# Patient Record
Sex: Male | Born: 1935
Health system: Southern US, Community
[De-identification: ages and names within clinical notes are randomized; demographics above are authoritative.]

## PROBLEM LIST (undated history)

## (undated) DIAGNOSIS — I10 Essential (primary) hypertension: Secondary | ICD-10-CM

## (undated) HISTORY — PX: TONSILLECTOMY: SUR1361

## (undated) HISTORY — PX: APPENDECTOMY: SHX54

## (undated) HISTORY — PX: HEMORROIDECTOMY: SUR656

---

## 2010-08-23 DIAGNOSIS — E119 Type 2 diabetes mellitus without complications: Secondary | ICD-10-CM | POA: Insufficient documentation

## 2011-10-23 DIAGNOSIS — I493 Ventricular premature depolarization: Secondary | ICD-10-CM | POA: Insufficient documentation

## 2013-05-03 DIAGNOSIS — E781 Pure hyperglyceridemia: Secondary | ICD-10-CM | POA: Insufficient documentation

## 2014-07-16 DIAGNOSIS — H25812 Combined forms of age-related cataract, left eye: Secondary | ICD-10-CM | POA: Insufficient documentation

## 2015-01-12 DIAGNOSIS — E785 Hyperlipidemia, unspecified: Secondary | ICD-10-CM | POA: Diagnosis not present

## 2015-01-12 DIAGNOSIS — N183 Chronic kidney disease, stage 3 (moderate): Secondary | ICD-10-CM | POA: Diagnosis not present

## 2015-01-12 DIAGNOSIS — I10 Essential (primary) hypertension: Secondary | ICD-10-CM | POA: Diagnosis not present

## 2015-01-12 DIAGNOSIS — I129 Hypertensive chronic kidney disease with stage 1 through stage 4 chronic kidney disease, or unspecified chronic kidney disease: Secondary | ICD-10-CM | POA: Diagnosis not present

## 2015-01-12 DIAGNOSIS — E1122 Type 2 diabetes mellitus with diabetic chronic kidney disease: Secondary | ICD-10-CM | POA: Diagnosis not present

## 2015-01-12 DIAGNOSIS — E1159 Type 2 diabetes mellitus with other circulatory complications: Secondary | ICD-10-CM | POA: Diagnosis not present

## 2015-02-16 DIAGNOSIS — H02834 Dermatochalasis of left upper eyelid: Secondary | ICD-10-CM | POA: Diagnosis not present

## 2015-02-16 DIAGNOSIS — H40003 Preglaucoma, unspecified, bilateral: Secondary | ICD-10-CM | POA: Diagnosis not present

## 2015-02-16 DIAGNOSIS — H01004 Unspecified blepharitis left upper eyelid: Secondary | ICD-10-CM | POA: Diagnosis not present

## 2015-02-16 DIAGNOSIS — Z961 Presence of intraocular lens: Secondary | ICD-10-CM | POA: Diagnosis not present

## 2015-02-16 DIAGNOSIS — H26493 Other secondary cataract, bilateral: Secondary | ICD-10-CM | POA: Diagnosis not present

## 2015-02-16 DIAGNOSIS — H01001 Unspecified blepharitis right upper eyelid: Secondary | ICD-10-CM | POA: Diagnosis not present

## 2015-02-16 DIAGNOSIS — H527 Unspecified disorder of refraction: Secondary | ICD-10-CM | POA: Diagnosis not present

## 2015-02-16 DIAGNOSIS — H02831 Dermatochalasis of right upper eyelid: Secondary | ICD-10-CM | POA: Diagnosis not present

## 2015-02-16 DIAGNOSIS — H353131 Nonexudative age-related macular degeneration, bilateral, early dry stage: Secondary | ICD-10-CM | POA: Diagnosis not present

## 2015-09-01 DIAGNOSIS — E785 Hyperlipidemia, unspecified: Secondary | ICD-10-CM | POA: Diagnosis not present

## 2015-09-01 DIAGNOSIS — I129 Hypertensive chronic kidney disease with stage 1 through stage 4 chronic kidney disease, or unspecified chronic kidney disease: Secondary | ICD-10-CM | POA: Diagnosis not present

## 2015-09-01 DIAGNOSIS — E1159 Type 2 diabetes mellitus with other circulatory complications: Secondary | ICD-10-CM | POA: Diagnosis not present

## 2015-09-01 DIAGNOSIS — N183 Chronic kidney disease, stage 3 (moderate): Secondary | ICD-10-CM | POA: Diagnosis not present

## 2015-09-01 DIAGNOSIS — E1122 Type 2 diabetes mellitus with diabetic chronic kidney disease: Secondary | ICD-10-CM | POA: Diagnosis not present

## 2015-09-14 DIAGNOSIS — N183 Chronic kidney disease, stage 3 (moderate): Secondary | ICD-10-CM | POA: Diagnosis not present

## 2015-09-14 DIAGNOSIS — I129 Hypertensive chronic kidney disease with stage 1 through stage 4 chronic kidney disease, or unspecified chronic kidney disease: Secondary | ICD-10-CM | POA: Diagnosis not present

## 2015-09-14 DIAGNOSIS — E785 Hyperlipidemia, unspecified: Secondary | ICD-10-CM | POA: Diagnosis not present

## 2015-09-14 DIAGNOSIS — E1159 Type 2 diabetes mellitus with other circulatory complications: Secondary | ICD-10-CM | POA: Diagnosis not present

## 2015-09-14 DIAGNOSIS — E1122 Type 2 diabetes mellitus with diabetic chronic kidney disease: Secondary | ICD-10-CM | POA: Diagnosis not present

## 2015-12-01 DIAGNOSIS — E785 Hyperlipidemia, unspecified: Secondary | ICD-10-CM | POA: Diagnosis not present

## 2015-12-06 DIAGNOSIS — E785 Hyperlipidemia, unspecified: Secondary | ICD-10-CM | POA: Diagnosis not present

## 2015-12-06 DIAGNOSIS — E1169 Type 2 diabetes mellitus with other specified complication: Secondary | ICD-10-CM | POA: Diagnosis not present

## 2015-12-06 DIAGNOSIS — E1159 Type 2 diabetes mellitus with other circulatory complications: Secondary | ICD-10-CM | POA: Diagnosis not present

## 2015-12-06 DIAGNOSIS — I129 Hypertensive chronic kidney disease with stage 1 through stage 4 chronic kidney disease, or unspecified chronic kidney disease: Secondary | ICD-10-CM | POA: Diagnosis not present

## 2015-12-06 DIAGNOSIS — E1122 Type 2 diabetes mellitus with diabetic chronic kidney disease: Secondary | ICD-10-CM | POA: Diagnosis not present

## 2015-12-06 DIAGNOSIS — N183 Chronic kidney disease, stage 3 unspecified: Secondary | ICD-10-CM | POA: Insufficient documentation

## 2015-12-07 DIAGNOSIS — E1169 Type 2 diabetes mellitus with other specified complication: Secondary | ICD-10-CM | POA: Insufficient documentation

## 2016-08-02 DIAGNOSIS — R5383 Other fatigue: Secondary | ICD-10-CM | POA: Diagnosis not present

## 2016-08-02 DIAGNOSIS — N183 Chronic kidney disease, stage 3 (moderate): Secondary | ICD-10-CM | POA: Diagnosis not present

## 2016-08-02 DIAGNOSIS — E1122 Type 2 diabetes mellitus with diabetic chronic kidney disease: Secondary | ICD-10-CM | POA: Diagnosis not present

## 2016-08-02 DIAGNOSIS — Z79899 Other long term (current) drug therapy: Secondary | ICD-10-CM | POA: Diagnosis not present

## 2016-08-02 DIAGNOSIS — R3915 Urgency of urination: Secondary | ICD-10-CM | POA: Insufficient documentation

## 2016-08-02 DIAGNOSIS — Z125 Encounter for screening for malignant neoplasm of prostate: Secondary | ICD-10-CM | POA: Diagnosis not present

## 2016-08-02 DIAGNOSIS — E1169 Type 2 diabetes mellitus with other specified complication: Secondary | ICD-10-CM | POA: Diagnosis not present

## 2016-08-02 DIAGNOSIS — E1159 Type 2 diabetes mellitus with other circulatory complications: Secondary | ICD-10-CM | POA: Diagnosis not present

## 2016-08-31 DIAGNOSIS — H47393 Other disorders of optic disc, bilateral: Secondary | ICD-10-CM | POA: Diagnosis not present

## 2016-08-31 DIAGNOSIS — H40003 Preglaucoma, unspecified, bilateral: Secondary | ICD-10-CM | POA: Diagnosis not present

## 2016-08-31 DIAGNOSIS — H52203 Unspecified astigmatism, bilateral: Secondary | ICD-10-CM | POA: Diagnosis not present

## 2016-08-31 DIAGNOSIS — E119 Type 2 diabetes mellitus without complications: Secondary | ICD-10-CM | POA: Diagnosis not present

## 2016-09-04 DIAGNOSIS — E1159 Type 2 diabetes mellitus with other circulatory complications: Secondary | ICD-10-CM | POA: Diagnosis not present

## 2016-09-04 DIAGNOSIS — M1711 Unilateral primary osteoarthritis, right knee: Secondary | ICD-10-CM | POA: Diagnosis not present

## 2016-09-04 DIAGNOSIS — I10 Essential (primary) hypertension: Secondary | ICD-10-CM | POA: Diagnosis not present

## 2016-09-14 DIAGNOSIS — H47393 Other disorders of optic disc, bilateral: Secondary | ICD-10-CM | POA: Diagnosis not present

## 2016-09-14 DIAGNOSIS — H40003 Preglaucoma, unspecified, bilateral: Secondary | ICD-10-CM | POA: Diagnosis not present

## 2017-02-05 DIAGNOSIS — E1159 Type 2 diabetes mellitus with other circulatory complications: Secondary | ICD-10-CM | POA: Diagnosis not present

## 2017-02-05 DIAGNOSIS — E1169 Type 2 diabetes mellitus with other specified complication: Secondary | ICD-10-CM | POA: Diagnosis not present

## 2017-02-05 DIAGNOSIS — M545 Low back pain: Secondary | ICD-10-CM | POA: Diagnosis not present

## 2017-02-05 DIAGNOSIS — M5441 Lumbago with sciatica, right side: Secondary | ICD-10-CM | POA: Diagnosis not present

## 2017-02-05 DIAGNOSIS — R7989 Other specified abnormal findings of blood chemistry: Secondary | ICD-10-CM | POA: Diagnosis not present

## 2017-02-05 DIAGNOSIS — E785 Hyperlipidemia, unspecified: Secondary | ICD-10-CM | POA: Diagnosis not present

## 2017-02-05 DIAGNOSIS — E1122 Type 2 diabetes mellitus with diabetic chronic kidney disease: Secondary | ICD-10-CM | POA: Diagnosis not present

## 2017-02-05 DIAGNOSIS — I129 Hypertensive chronic kidney disease with stage 1 through stage 4 chronic kidney disease, or unspecified chronic kidney disease: Secondary | ICD-10-CM | POA: Diagnosis not present

## 2017-02-05 DIAGNOSIS — N183 Chronic kidney disease, stage 3 (moderate): Secondary | ICD-10-CM | POA: Diagnosis not present

## 2017-02-09 DIAGNOSIS — E538 Deficiency of other specified B group vitamins: Secondary | ICD-10-CM | POA: Insufficient documentation

## 2017-09-26 DIAGNOSIS — R152 Fecal urgency: Secondary | ICD-10-CM | POA: Diagnosis not present

## 2017-09-26 DIAGNOSIS — R159 Full incontinence of feces: Secondary | ICD-10-CM | POA: Diagnosis not present

## 2017-09-26 DIAGNOSIS — N3941 Urge incontinence: Secondary | ICD-10-CM | POA: Diagnosis not present

## 2017-09-26 DIAGNOSIS — I129 Hypertensive chronic kidney disease with stage 1 through stage 4 chronic kidney disease, or unspecified chronic kidney disease: Secondary | ICD-10-CM | POA: Diagnosis not present

## 2017-09-26 DIAGNOSIS — Z0000 Encounter for general adult medical examination without abnormal findings: Secondary | ICD-10-CM | POA: Diagnosis not present

## 2017-09-26 DIAGNOSIS — E1122 Type 2 diabetes mellitus with diabetic chronic kidney disease: Secondary | ICD-10-CM | POA: Diagnosis not present

## 2017-09-26 DIAGNOSIS — Z125 Encounter for screening for malignant neoplasm of prostate: Secondary | ICD-10-CM | POA: Diagnosis not present

## 2017-09-26 DIAGNOSIS — E538 Deficiency of other specified B group vitamins: Secondary | ICD-10-CM | POA: Diagnosis not present

## 2017-09-26 DIAGNOSIS — N183 Chronic kidney disease, stage 3 (moderate): Secondary | ICD-10-CM | POA: Diagnosis not present

## 2017-09-26 DIAGNOSIS — R3915 Urgency of urination: Secondary | ICD-10-CM | POA: Diagnosis not present

## 2017-09-26 DIAGNOSIS — E1159 Type 2 diabetes mellitus with other circulatory complications: Secondary | ICD-10-CM | POA: Diagnosis not present

## 2017-11-14 DIAGNOSIS — E1122 Type 2 diabetes mellitus with diabetic chronic kidney disease: Secondary | ICD-10-CM | POA: Diagnosis not present

## 2017-11-14 DIAGNOSIS — E1159 Type 2 diabetes mellitus with other circulatory complications: Secondary | ICD-10-CM | POA: Diagnosis not present

## 2017-11-14 DIAGNOSIS — N183 Chronic kidney disease, stage 3 (moderate): Secondary | ICD-10-CM | POA: Diagnosis not present

## 2017-11-14 DIAGNOSIS — I152 Hypertension secondary to endocrine disorders: Secondary | ICD-10-CM | POA: Diagnosis not present

## 2017-11-14 DIAGNOSIS — R413 Other amnesia: Secondary | ICD-10-CM | POA: Diagnosis not present

## 2017-12-14 DIAGNOSIS — R609 Edema, unspecified: Secondary | ICD-10-CM | POA: Diagnosis not present

## 2017-12-14 DIAGNOSIS — E1122 Type 2 diabetes mellitus with diabetic chronic kidney disease: Secondary | ICD-10-CM | POA: Diagnosis not present

## 2017-12-14 DIAGNOSIS — E538 Deficiency of other specified B group vitamins: Secondary | ICD-10-CM | POA: Diagnosis not present

## 2017-12-14 DIAGNOSIS — E1159 Type 2 diabetes mellitus with other circulatory complications: Secondary | ICD-10-CM | POA: Diagnosis not present

## 2017-12-14 DIAGNOSIS — I152 Hypertension secondary to endocrine disorders: Secondary | ICD-10-CM | POA: Diagnosis not present

## 2017-12-14 DIAGNOSIS — N183 Chronic kidney disease, stage 3 (moderate): Secondary | ICD-10-CM | POA: Diagnosis not present

## 2018-03-08 DIAGNOSIS — E1122 Type 2 diabetes mellitus with diabetic chronic kidney disease: Secondary | ICD-10-CM | POA: Diagnosis not present

## 2018-03-08 DIAGNOSIS — E1159 Type 2 diabetes mellitus with other circulatory complications: Secondary | ICD-10-CM | POA: Diagnosis not present

## 2018-03-08 DIAGNOSIS — E538 Deficiency of other specified B group vitamins: Secondary | ICD-10-CM | POA: Diagnosis not present

## 2018-03-08 DIAGNOSIS — N183 Chronic kidney disease, stage 3 (moderate): Secondary | ICD-10-CM | POA: Diagnosis not present

## 2018-03-08 DIAGNOSIS — R609 Edema, unspecified: Secondary | ICD-10-CM | POA: Diagnosis not present

## 2018-03-08 DIAGNOSIS — I129 Hypertensive chronic kidney disease with stage 1 through stage 4 chronic kidney disease, or unspecified chronic kidney disease: Secondary | ICD-10-CM | POA: Diagnosis not present

## 2018-06-15 ENCOUNTER — Emergency Department (HOSPITAL_BASED_OUTPATIENT_CLINIC_OR_DEPARTMENT_OTHER)
Admission: EM | Admit: 2018-06-15 | Discharge: 2018-06-15 | Disposition: A | Discharge status: Home / Self Care | Payer: PPO | Attending: Emergency Medicine | Admitting: Emergency Medicine

## 2018-06-15 ENCOUNTER — Other Ambulatory Visit: Payer: Self-pay

## 2018-06-15 ENCOUNTER — Encounter (HOSPITAL_BASED_OUTPATIENT_CLINIC_OR_DEPARTMENT_OTHER): Payer: Self-pay | Admitting: Emergency Medicine

## 2018-06-15 ENCOUNTER — Emergency Department (HOSPITAL_BASED_OUTPATIENT_CLINIC_OR_DEPARTMENT_OTHER): Payer: PPO

## 2018-06-15 DIAGNOSIS — H1131 Conjunctival hemorrhage, right eye: Secondary | ICD-10-CM

## 2018-06-15 DIAGNOSIS — E1159 Type 2 diabetes mellitus with other circulatory complications: Secondary | ICD-10-CM | POA: Diagnosis not present

## 2018-06-15 DIAGNOSIS — S0990XA Unspecified injury of head, initial encounter: Secondary | ICD-10-CM | POA: Diagnosis present

## 2018-06-15 DIAGNOSIS — S0511XA Contusion of eyeball and orbital tissues, right eye, initial encounter: Secondary | ICD-10-CM | POA: Diagnosis not present

## 2018-06-15 DIAGNOSIS — G319 Degenerative disease of nervous system, unspecified: Secondary | ICD-10-CM | POA: Diagnosis not present

## 2018-06-15 DIAGNOSIS — S0240CA Maxillary fracture, right side, initial encounter for closed fracture: Secondary | ICD-10-CM | POA: Diagnosis not present

## 2018-06-15 DIAGNOSIS — S02401A Maxillary fracture, unspecified, initial encounter for closed fracture: Secondary | ICD-10-CM

## 2018-06-15 DIAGNOSIS — H05231 Hemorrhage of right orbit: Secondary | ICD-10-CM | POA: Diagnosis not present

## 2018-06-15 DIAGNOSIS — Y92480 Sidewalk as the place of occurrence of the external cause: Secondary | ICD-10-CM | POA: Insufficient documentation

## 2018-06-15 DIAGNOSIS — H5789 Other specified disorders of eye and adnexa: Secondary | ICD-10-CM | POA: Diagnosis not present

## 2018-06-15 DIAGNOSIS — S0083XA Contusion of other part of head, initial encounter: Secondary | ICD-10-CM | POA: Diagnosis not present

## 2018-06-15 DIAGNOSIS — Y999 Unspecified external cause status: Secondary | ICD-10-CM | POA: Insufficient documentation

## 2018-06-15 DIAGNOSIS — W010XXA Fall on same level from slipping, tripping and stumbling without subsequent striking against object, initial encounter: Secondary | ICD-10-CM | POA: Insufficient documentation

## 2018-06-15 DIAGNOSIS — J342 Deviated nasal septum: Secondary | ICD-10-CM | POA: Diagnosis not present

## 2018-06-15 DIAGNOSIS — I10 Essential (primary) hypertension: Secondary | ICD-10-CM

## 2018-06-15 DIAGNOSIS — Y9301 Activity, walking, marching and hiking: Secondary | ICD-10-CM | POA: Diagnosis not present

## 2018-06-15 HISTORY — DX: Essential (primary) hypertension: I10

## 2018-06-15 NOTE — ED Provider Notes (Signed)
[place] EMERGENCY DEPARTMENT Provider Note   CSN: [id number] Arrival date & time: 06/15/18  0944    History   Chief Complaint Chief Complaint  Patient presents with  . Fall    HPI David Nichols is a 85 y.o. male.     HPI Patient presents to the emergency room for evaluation of an eye injury.  Patient was walking yesterday when he tripped and fell on the sidewalk injuring his face.  Patient did not lose consciousness.  He is not having a headache but he has developed significant bruising around his eye.  Patient also noticed that his eye was red this morning.  He does not feel that his vision is affected other than having some obstruction of his vision from the swollen eyelid.  Patient denies any neck pain or any other injuries.  He does take an aspirin but denies any other anticoagulants. Past Medical History:  Diagnosis Date  . Hypertension     There are no active problems to display for this patient.   Past Surgical History:  Procedure Laterality Date  . APPENDECTOMY    . HEMORROIDECTOMY    . TONSILLECTOMY          Home Medications    Prior to Admission medications   Not on File    Family History No family history on file.  Social History Social History   Tobacco Use  . Smoking status: Never Smoker  . Smokeless tobacco: Never Used  Substance Use Topics  . Alcohol use: Not Currently  . Drug use: Never     Allergies   Patient has no known allergies.   Review of Systems Review of Systems  All other systems reviewed and are negative.    Physical Exam Updated Vital Signs BP (!) 191/83 (BP Location: Right Arm)   Pulse 64   Temp 98.4 F (36.9 C) (Oral)   Resp 18   Ht 1.727 m (5\' 8" )   Wt 88.9 kg   SpO2 93%   BMI 29.80 kg/m   Physical Exam Vitals signs and nursing note reviewed.  Constitutional:      General: He is not in acute distress.    Appearance: He is well-developed.  HENT:     Head: Normocephalic. Contusion  present.     Right Ear: External ear normal.     Left Ear: External ear normal.  Eyes:     General: No scleral icterus.       Right eye: No discharge.        Left eye: No discharge.     Conjunctiva/sclera:     Right eye: Hemorrhage present.     Comments: Ecchymoses surrounding the eye, ecchymoses and edema of the upper and lower eyelids, large subconjunctival hemorrhage, pupil is round and reactive, periorbital tenderness  Neck:     Musculoskeletal: Neck supple.     Trachea: No tracheal deviation.  Cardiovascular:     Rate and Rhythm: Normal rate.  Pulmonary:     Effort: Pulmonary effort is normal. No respiratory distress.     Breath sounds: No stridor.  Abdominal:     General: There is no distension.  Musculoskeletal:        General: No swelling or deformity.  Skin:    General: Skin is warm and dry.     Findings: No rash.  Neurological:     Mental Status: He is alert.     Cranial Nerves: Cranial nerve deficit: no gross deficits.  ED Treatments / Results  Labs (all labs ordered are listed, but only abnormal results are displayed) Labs Reviewed - No data to display  EKG None  Radiology Ct Orbits Wo Contrast  Result Date: 06/15/2018 CLINICAL DATA:  Pain following fall EXAM: CT ORBITS WITHOUT CONTRAST TECHNIQUE: Multidetector CT images were obtained using the standard protocol without intravenous contrast. COMPARISON:  None. FINDINGS: Orbits: There is preseptal soft tissue edema on the right. No intraorbital lesion evident. Orbits appear symmetric bilaterally. Globes bilaterally appear intact. There is a nondisplaced fracture along the anterior right maxillary wall medially. There is no orbital wall fracture. No blastic or lytic bone lesions. Visualized sinuses: There is a small amount of fluid in the inferior right maxillary antrum which may be secondary to trauma. There is mucosal thickening in the right frontal sinus. Paranasal sinuses which are visualized elsewhere  appear unremarkable. Ostiomeatal unit complexes are patent bilaterally. There is mild rightward deviation of the nasal septum. There is no nares obstruction. Soft tissues: There is soft tissue swelling over the lateral upper right face and preseptal orbital region. There is also soft tissue swelling with developing hematoma over the lateral right frontal sinus region. No fracture evident in this area. No soft tissue abscess. Limited intracranial: There is moderate diffuse atrophy. Visualized intracranial regions show no focal infarct. No mass or hemorrhage. No midline shift. IMPRESSION: 1. No intraorbital fracture or dislocation. No intraorbital lesion. There is preseptal soft tissue edema over the right orbit. 2. Nondisplaced fracture of the medial anterior right maxillary sinus wall. No other fracture evident. Small amount of fluid noted in inferior right maxillary antrum. 3. There is soft tissue swelling over the upper face on the right. There is also developing hematoma over the right lateral frontal sinus. No underlying fracture. 4. Ostiomeatal unit complexes are patent bilaterally. There is rightward deviation of the nasal septum. 5.  Moderate diffuse atrophy intracranially. Electronically Signed   By: Lowella Grip III M.D.   On: 06/15/2018 11:02    Procedures Procedures (including critical care time)  Medications Ordered in ED Medications - No data to display   Initial Impression / Assessment and Plan / ED Course  I have reviewed the triage vital signs and the nursing notes.  Pertinent labs & imaging results that were available during my care of the patient were reviewed by me and considered in my medical decision making (see chart for details).   Patient presented with a facial injury after a fall yesterday.  On exam patient was noted to have significant facial bruising and a subconjunctival hemorrhage.  He did not have any evidence of entrapment.  He is not having any headaches or neck  pain.  No signs to suggest serious head injury.  CT scan does demonstrate a sinus fracture but no evidence of orbital blowout fracture.  Patient is having normal vision I do not see any other evidence of ocular injury other than subconjunctival hemorrhage.  Recommend follow-up with a primary care doctor regarding his hypertension.  Recommend outpatient follow-up with an ophthalmologist.  Ice and over-the-counter medications as needed  Final Clinical Impressions(s) / ED Diagnoses   Final diagnoses:  Contusion of face, initial encounter  Subconjunctival hemorrhage of right eye  Hypertension, unspecified type  Closed fracture of maxillary sinus, initial encounter [facility])    ED Discharge Orders    None       Dorie Rank, MD 06/15/18 1126

## 2018-06-15 NOTE — Discharge Instructions (Signed)
Apply ice to help with the swelling and bruising.  Take over-the-counter medications as needed for pain.  Follow-up with an eye doctor next week to be rechecked  The fracture noted on the CT scan should heal on its own in time.  Return to the ED for trouble with headache confusion, vomiting

## 2018-06-15 NOTE — ED Triage Notes (Signed)
Pt fell yesterday, face first on the sidewalk. Denies LOC. Has bruising around R eye and bleeding in the conjunctiva noted. No other reported injuries.

## 2018-06-15 NOTE — ED Notes (Signed)
Pt verbalized understanding of dc instructions.

## 2018-06-15 NOTE — ED Notes (Signed)
Pt fell yesterday, striking right side of forehead on pavement.  Denies LOC, significant bruising/swelling surrounding right eye/orbit.  Sclera/globe of eye reddened.  Pt denies vision changes, utilizes reading glasses only, no prescription lenses.  Denies dizziness, ambulates with steady gait.

## 2018-06-17 DIAGNOSIS — Z79899 Other long term (current) drug therapy: Secondary | ICD-10-CM | POA: Diagnosis not present

## 2018-06-17 DIAGNOSIS — H1131 Conjunctival hemorrhage, right eye: Secondary | ICD-10-CM | POA: Diagnosis not present

## 2018-06-17 DIAGNOSIS — I10 Essential (primary) hypertension: Secondary | ICD-10-CM | POA: Diagnosis not present

## 2018-06-17 DIAGNOSIS — W19XXXD Unspecified fall, subsequent encounter: Secondary | ICD-10-CM | POA: Diagnosis not present

## 2018-06-17 DIAGNOSIS — S0083XD Contusion of other part of head, subsequent encounter: Secondary | ICD-10-CM | POA: Diagnosis not present

## 2018-06-17 DIAGNOSIS — S0240CD Maxillary fracture, right side, subsequent encounter for fracture with routine healing: Secondary | ICD-10-CM | POA: Diagnosis not present

## 2018-06-19 ENCOUNTER — Other Ambulatory Visit: Payer: Self-pay | Admitting: *Deleted

## 2018-06-19 NOTE — Patient Outreach (Signed)
[place] [facility]) Care Management  06/19/2018  Akif Weldy Sr. 03-01-1932 [id number]   Subjective: Telephone call to patient's home number, no answer, left HIPAA compliant voicemail message, and requested call back.    Objective: Per KPN (Knowledge Performance Now, point of care tool) and chart review, patient has had no recent hospitalizations.  Patient had ED visit on 06/15/2018 for status post fall, Contusion of face, Subconjunctival hemorrhage of right eye, and Closed fracture of maxillary sinus. Patient also has a history of hypertension.      Assessment: Received [employer] Nurse Call Line follow up referral on 06/17/2018.   Referral reason: Patient fell on 06/14/2018, has a black eye, blood pressure 185/87, base line pressure 170/ 75, and nurse call line advised patient to go to ED.   Screening  follow up pending patient contact.     Plan: RNCM will call patient for 2nd telephone outreach attempt within 4 business days, [facility] EMMI follow up, and proceed with case closure, within 10 business days if no return call.     Thatcher Doberstein H. Annia Friendly, BSN, [place] Management [facility] Telephonic CM Phone: [phone] Fax: [phone]

## 2018-06-20 ENCOUNTER — Other Ambulatory Visit: Payer: Self-pay | Admitting: *Deleted

## 2018-06-20 NOTE — Patient Outreach (Signed)
[place] [facility]) Care Management  06/20/2018  David Plucinski Sr. [birth year] [id number]   Subjective: Telephone call to patient's home number, no answer, left HIPAA compliant voicemail message, and requested call back.    Objective: Per KPN (Knowledge Performance Now, point of care tool) and chart review, patient has had no recent hospitalizations.  Patient had ED visit on 06/15/2018 for status post fall, Contusion of face, Subconjunctival hemorrhage of right eye, and Closed fracture of maxillary sinus. Patient also has a history of hypertension.      Assessment: Received [employer] Nurse Call Line follow up referral on 06/17/2018.   Referral reason: Patient fell on 06/14/2018, has a black eye, blood pressure 185/87, base line pressure 170/ 75, and nurse call line advised patient to go to ED.   Screening  follow up pending patient contact.     Plan: RNCM will call patient for 3rd telephone outreach attempt within 4 business days, [facility] EMMI follow up, and proceed with case closure, within 10 business days if no return call.    Keonda Dow H. Annia Friendly, BSN, [place] Management [facility] Telephonic CM Phone: [phone] Fax: [phone]

## 2018-06-21 ENCOUNTER — Other Ambulatory Visit: Payer: Self-pay | Admitting: *Deleted

## 2018-06-21 NOTE — Patient Outreach (Signed)
[place] [facility]) Care Management  06/21/2018  Mable Lashley Sr. Nov 25, 1932 [id number]   Subjective: Telephone call to patient's mobile number, no answer, no voicemail option, and unable to leave message. Telephone call to patient's home number, no answer, left HIPAA compliant voicemail message, and requested call back. Case discussed with Bary Castilla Assistant Clinical Director at [facility].    Objective:Per KPN (Knowledge Performance Now, point of care tool) and chart review,patient has had no recent hospitalizations. Patient had ED visit on 06/15/2018 for status post fall, Contusion of face,Subconjunctival hemorrhage of right eye, andClosed fracture of maxillary sinus. Patient also has a history of hypertension.     Assessment: Received [employer] Nurse Call Line follow up referral on 06/17/2018. Referral reason: Patient fell on 06/14/2018, has a black eye, blood pressure 185/87, base line pressure 170/ 75, and nurse call line advised patient to go to ED. Screening follow up pending patient contact.     Plan:Patient will be transitioned to [facility] for further follow up as of 06/24/2018.     Darleen Moffitt H. Annia Friendly, BSN, [place] Management [facility] Telephonic CM Phone: [phone] Fax: [phone]

## 2018-06-27 ENCOUNTER — Other Ambulatory Visit: Payer: Self-pay | Admitting: *Deleted

## 2018-06-27 NOTE — Patient Outreach (Signed)
[place] [facility]) Care Management  06/27/2018  Whitley Strycharz Sr. [birth year] [id number]   Subjective: Received voicemail message from Joylene John (wife of Tylerjames Hoglund Sr.) on 06/24/2018, states she is returning call, and requested call back if needed.   Case discussed with Bary Castilla Assistant Clinical Director at [facility].  No return call from patient by 06/21/2018 and patient was transitioned to [facility] for further CM services as of 06/24/2018.    Objective:Per KPN (Knowledge Performance Now, point of care tool) and chart review,patient has had no recent hospitalizations. Patient had ED visit on 06/15/2018 for status post fall, Contusion of face,Subconjunctival hemorrhage of right eye, andClosed fracture of maxillary sinus. Patient also has a history of hypertension.     Assessment: Received [employer] Nurse Call Line follow up referral on 06/17/2018. Referral reason: Patient fell on 06/14/2018, has a black eye, blood pressure 185/87, base line pressure 170/ 75, and nurse call line advised patient to go to ED. Screening follow up not completed due to unable to contact patient, transitioned to [facility] for further CM services, and case closed due to enrolled in external program.     Plan:Patient transitioned to [facility] for further CM services as of 06/24/2018 and case closed due to enrolled in external program.       Bianka Liberati H. Annia Friendly, BSN, [place] Management [facility] Telephonic CM Phone: [phone] Fax: [phone]

## 2018-10-09 DIAGNOSIS — I517 Cardiomegaly: Secondary | ICD-10-CM | POA: Diagnosis not present

## 2018-10-09 DIAGNOSIS — E1169 Type 2 diabetes mellitus with other specified complication: Secondary | ICD-10-CM | POA: Diagnosis not present

## 2018-10-09 DIAGNOSIS — I700 Atherosclerosis of aorta: Secondary | ICD-10-CM | POA: Diagnosis not present

## 2018-10-09 DIAGNOSIS — I10 Essential (primary) hypertension: Secondary | ICD-10-CM | POA: Diagnosis not present

## 2018-10-09 DIAGNOSIS — M2578 Osteophyte, vertebrae: Secondary | ICD-10-CM | POA: Diagnosis not present

## 2018-10-09 DIAGNOSIS — R918 Other nonspecific abnormal finding of lung field: Secondary | ICD-10-CM | POA: Diagnosis not present

## 2018-10-09 DIAGNOSIS — E1159 Type 2 diabetes mellitus with other circulatory complications: Secondary | ICD-10-CM | POA: Diagnosis not present

## 2018-10-09 DIAGNOSIS — N183 Chronic kidney disease, stage 3 (moderate): Secondary | ICD-10-CM | POA: Diagnosis not present

## 2018-10-09 DIAGNOSIS — Z0000 Encounter for general adult medical examination without abnormal findings: Secondary | ICD-10-CM | POA: Diagnosis not present

## 2018-10-09 DIAGNOSIS — I129 Hypertensive chronic kidney disease with stage 1 through stage 4 chronic kidney disease, or unspecified chronic kidney disease: Secondary | ICD-10-CM | POA: Diagnosis not present

## 2018-10-09 DIAGNOSIS — E538 Deficiency of other specified B group vitamins: Secondary | ICD-10-CM | POA: Diagnosis not present

## 2018-10-09 DIAGNOSIS — I152 Hypertension secondary to endocrine disorders: Secondary | ICD-10-CM | POA: Diagnosis not present

## 2018-10-09 DIAGNOSIS — E1122 Type 2 diabetes mellitus with diabetic chronic kidney disease: Secondary | ICD-10-CM | POA: Diagnosis not present

## 2018-10-09 DIAGNOSIS — R0989 Other specified symptoms and signs involving the circulatory and respiratory systems: Secondary | ICD-10-CM | POA: Diagnosis not present

## 2018-10-09 DIAGNOSIS — E785 Hyperlipidemia, unspecified: Secondary | ICD-10-CM | POA: Diagnosis not present

## 2019-07-20 ENCOUNTER — Encounter (HOSPITAL_BASED_OUTPATIENT_CLINIC_OR_DEPARTMENT_OTHER): Payer: Self-pay | Admitting: Emergency Medicine

## 2019-07-20 ENCOUNTER — Emergency Department (HOSPITAL_BASED_OUTPATIENT_CLINIC_OR_DEPARTMENT_OTHER)
Admission: EM | Admit: 2019-07-20 | Discharge: 2019-07-20 | Disposition: A | Discharge status: Home / Self Care | Payer: Medicare HMO | Attending: Emergency Medicine | Admitting: Emergency Medicine

## 2019-07-20 ENCOUNTER — Other Ambulatory Visit: Payer: Self-pay

## 2019-07-20 DIAGNOSIS — I10 Essential (primary) hypertension: Secondary | ICD-10-CM | POA: Diagnosis not present

## 2019-07-20 DIAGNOSIS — B029 Zoster without complications: Secondary | ICD-10-CM | POA: Insufficient documentation

## 2019-07-20 DIAGNOSIS — R21 Rash and other nonspecific skin eruption: Secondary | ICD-10-CM | POA: Diagnosis present

## 2019-07-20 MED ORDER — VALACYCLOVIR HCL 1 G PO TABS: 1000.0000 mg | ORAL_TABLET | Freq: Three times a day (TID) | ORAL | 0 refills | Status: DC

## 2019-07-20 NOTE — Discharge Instructions (Addendum)
Take the medications as prescribed, monitor for worsening symptoms such as fevers and chills, follow-up with your doctor to be rechecked next week

## 2019-07-20 NOTE — ED Provider Notes (Signed)
David Nichols   CSN: [id number] Arrival date & time: 07/20/19  0848     History Chief Complaint  Patient presents with  . Rash    David Rhinesmith Sr. is a 86 y.o. male.  HPI   Patient presented to the emergency room for evaluation of a rash in his right flank hip area.  Patient states that started about a week ago.  Initially it was a small circular area.  However in the last week it has continued to increase in size.  Patient states the rash now goes from his back flank area towards the front of his inguinal area and his right thigh.  It is mildly itchy and mildly painful.  Patient came to the ED for evaluation since it has been increasing in size and not going away.  He thinks he probably has shingles.  He denies any fevers or chills.  No nausea vomiting.  No other complaints.  Past Medical History:  Diagnosis Date  . Hypertension     There are no problems to display for this patient.   Past Surgical History:  Procedure Laterality Date  . APPENDECTOMY    . HEMORROIDECTOMY    . TONSILLECTOMY         No family history on file.  Social History   Tobacco Use  . Smoking status: Never Smoker  . Smokeless tobacco: Never Used  Substance Use Topics  . Alcohol use: Not Currently  . Drug use: Never    Home Medications Prior to Admission medications   Medication Sig Start Date End Date Taking? Authorizing Provider  lisinopril (ZESTRIL) 10 MG tablet Take 10 mg by mouth daily.   Yes [provider]  metoprolol tartrate (LOPRESSOR) 25 MG tablet Take 25 mg by mouth 2 (two) times daily.   Yes [provider]  valACYclovir (VALTREX) 1000 MG tablet Take 1 tablet (1,000 mg total) by mouth 3 (three) times daily. 07/20/19   David Rank, MD    Allergies    Patient has no known allergies.  Review of Systems   Review of Systems  All other systems reviewed and are negative.   Physical Exam Updated Vital Signs BP (!)  162/83 (BP Location: Right Arm)   Pulse 72   Temp 98.7 F (37.1 C) (Oral)   Resp 20   Ht 1.727 m (5\' 8" )   Wt 88.5 kg   SpO2 98%   BMI 29.65 kg/m   Physical Exam Vitals and nursing Nichols reviewed.  Constitutional:      General: He is not in acute distress.    Appearance: He is well-developed.  HENT:     Head: Normocephalic and atraumatic.     Right Ear: External ear normal.     Left Ear: External ear normal.  Eyes:     General: No scleral icterus.       Right eye: No discharge.        Left eye: No discharge.     Conjunctiva/sclera: Conjunctivae normal.  Neck:     Trachea: No tracheal deviation.  Cardiovascular:     Rate and Rhythm: Normal rate and regular rhythm.  Pulmonary:     Effort: Pulmonary effort is normal. No respiratory distress.     Breath sounds: Normal breath sounds. No stridor.  Abdominal:     General: There is no distension.  Musculoskeletal:        General: No swelling or deformity.     Cervical back: Neck  supple.  Skin:    General: Skin is warm and dry.     Findings: Rash present.     Comments: Vesicular erythematous rash involving the right flank thigh and inguinal region, no lymphangitic streaking, no purulent drainage, rash does not cross the midline  Neurological:     Mental Status: He is alert.     Cranial Nerves: Cranial nerve deficit: no gross deficits.     ED Results / Procedures / Treatments   Labs (all labs ordered are listed, but only abnormal results are displayed) Labs Reviewed - No data to display  EKG None  Radiology No results found.  Procedures Procedures (including critical care time)  Medications Ordered in ED Medications - No data to display  ED Course  I have reviewed the triage vital signs and the nursing notes.  Pertinent labs & imaging results that were available during my care of the patient were reviewed by me and considered in my medical decision making (see chart for details).    MDM  Rules/Calculators/A&P                          Patient's rash is consistent with shingles.  It has been going on for at least a week however he does seem to have fresh vesicles and the extensive size I believe warrants a course of valacyclovir.  Patient's not having any significant pain itching.  Discussed warning signs precautions.  Outpatient follow-up. Final Clinical Impression(s) / ED Diagnoses Final diagnoses:  Herpes zoster without complication    Rx / DC Orders ED Discharge Orders         Ordered    valACYclovir (VALTREX) 1000 MG tablet  3 times daily     Discontinue  Reprint     07/20/19 7564           David Rank, MD 07/20/19 [phone]

## 2019-07-20 NOTE — ED Triage Notes (Signed)
Painful rash to R flank area and R hip/leg

## 2020-01-03 ENCOUNTER — Emergency Department (HOSPITAL_BASED_OUTPATIENT_CLINIC_OR_DEPARTMENT_OTHER): Payer: Medicare HMO

## 2020-01-03 ENCOUNTER — Encounter (HOSPITAL_BASED_OUTPATIENT_CLINIC_OR_DEPARTMENT_OTHER): Payer: Self-pay | Admitting: *Deleted

## 2020-01-03 ENCOUNTER — Other Ambulatory Visit: Payer: Self-pay

## 2020-01-03 ENCOUNTER — Emergency Department (HOSPITAL_BASED_OUTPATIENT_CLINIC_OR_DEPARTMENT_OTHER)
Admission: EM | Admit: 2020-01-03 | Discharge: 2020-01-04 | Disposition: A | Discharge status: Home / Self Care | Payer: Medicare HMO | Attending: Emergency Medicine | Admitting: Emergency Medicine

## 2020-01-03 DIAGNOSIS — I10 Essential (primary) hypertension: Secondary | ICD-10-CM | POA: Insufficient documentation

## 2020-01-03 DIAGNOSIS — F4321 Adjustment disorder with depressed mood: Secondary | ICD-10-CM

## 2020-01-03 DIAGNOSIS — R0789 Other chest pain: Secondary | ICD-10-CM | POA: Diagnosis not present

## 2020-01-03 DIAGNOSIS — Z79899 Other long term (current) drug therapy: Secondary | ICD-10-CM | POA: Diagnosis not present

## 2020-01-03 DIAGNOSIS — R079 Chest pain, unspecified: Secondary | ICD-10-CM | POA: Diagnosis present

## 2020-01-03 LAB — CBC WITH DIFFERENTIAL/PLATELET
Abs Immature Granulocytes: 0.11 10*3/uL — ABNORMAL HIGH (ref 0.00–0.07)
Basophils Absolute: 0 10*3/uL (ref 0.0–0.1)
Basophils Relative: 0 %
Eosinophils Absolute: 0.1 10*3/uL (ref 0.0–0.5)
Eosinophils Relative: 1 %
HCT: 45.1 % (ref 39.0–52.0)
Hemoglobin: 15.3 g/dL (ref 13.0–17.0)
Immature Granulocytes: 1 %
Lymphocytes Relative: 19 %
Lymphs Abs: 2.4 10*3/uL (ref 0.7–4.0)
MCH: 31.4 pg (ref 26.0–34.0)
MCHC: 33.9 g/dL (ref 30.0–36.0)
MCV: 92.6 fL (ref 80.0–100.0)
Monocytes Absolute: 0.9 10*3/uL (ref 0.1–1.0)
Monocytes Relative: 7 %
Neutro Abs: 9.3 10*3/uL — ABNORMAL HIGH (ref 1.7–7.7)
Neutrophils Relative %: 72 %
Platelets: 417 10*3/uL — ABNORMAL HIGH (ref 150–400)
RBC: 4.87 MIL/uL (ref 4.22–5.81)
RDW: 12.2 % (ref 11.5–15.5)
WBC: 12.9 10*3/uL — ABNORMAL HIGH (ref 4.0–10.5)
nRBC: 0 % (ref 0.0–0.2)

## 2020-01-03 LAB — BASIC METABOLIC PANEL
Anion gap: 12 (ref 5–15)
BUN: 55 mg/dL — ABNORMAL HIGH (ref 8–23)
CO2: 21 mmol/L — ABNORMAL LOW (ref 22–32)
Calcium: 9.3 mg/dL (ref 8.9–10.3)
Chloride: 101 mmol/L (ref 98–111)
Creatinine, Ser: 1.61 mg/dL — ABNORMAL HIGH (ref 0.61–1.24)
GFR, Estimated: 42 mL/min — ABNORMAL LOW (ref >60)
Glucose, Bld: 199 mg/dL — ABNORMAL HIGH (ref 70–99)
Potassium: 4.2 mmol/L (ref 3.5–5.1)
Sodium: 134 mmol/L — ABNORMAL LOW (ref 135–145)

## 2020-01-03 LAB — D-DIMER, QUANTITATIVE: D-Dimer, Quant: 0.89 ug/mL-FEU — ABNORMAL HIGH (ref 0.00–0.50)

## 2020-01-03 MED ORDER — LORAZEPAM 2 MG/ML IJ SOLN
0.5000 mg | Freq: Once | INTRAMUSCULAR | Status: AC
  Administered 2020-01-03: 0.5 mg via INTRAVENOUS
  Filled 2020-01-03: qty 1

## 2020-01-03 NOTE — ED Provider Notes (Signed)
[facility] Provider Note   CSN: [id number] Arrival date & time: 01/03/20  2239     History Chief Complaint  Patient presents with  . Chest Pain    Alphons Burgert Sr. is a 86 y.o. male.  HPI     This is an 86 year old male with a history of hypertension who presents with chest pain.  Patient's wife passed away 4 days ago.  Since that time he has had dull lower anterior chest pain that is nonradiating.  It is fairly constant.  Nothing seems to make it better or worse.  He has not had any cough, fever, upper respiratory symptoms.  No lower extremity swelling or history of blood clots.  He has never had pain like this before.  He rates his pain at 4 out of 10.  He reports that he has been very difficult to deal with his wife's passing.  Does not report significant anxiety but is appropriately sad and grieving.  He has not had any known sick contacts or Covid exposures.  He is not vaccinated.  Past Medical History:  Diagnosis Date  . Hypertension     There are no problems to display for this patient.   Past Surgical History:  Procedure Laterality Date  . APPENDECTOMY    . HEMORROIDECTOMY    . TONSILLECTOMY         No family history on file.  Social History   Tobacco Use  . Smoking status: Never Smoker  . Smokeless tobacco: Never Used  Substance Use Topics  . Alcohol use: Not Currently  . Drug use: Never    Home Medications Prior to Admission medications   Medication Sig Start Date End Date Taking? Authorizing Provider  lisinopril (ZESTRIL) 10 MG tablet Take 10 mg by mouth daily.   Yes [provider]  metoprolol tartrate (LOPRESSOR) 25 MG tablet Take 25 mg by mouth 2 (two) times daily.   Yes [provider]  valACYclovir (VALTREX) 1000 MG tablet Take 1 tablet (1,000 mg total) by mouth 3 (three) times daily. 07/20/19   Dorie Rank, MD    Allergies    Patient has no known allergies.  Review of Systems   Review of  Systems  Constitutional: Negative for fever.  Respiratory: Negative for cough and shortness of breath.   Cardiovascular: Positive for chest pain. Negative for leg swelling.  Gastrointestinal: Negative for abdominal pain, nausea and vomiting.  Genitourinary: Negative for dysuria.  Musculoskeletal: Negative for back pain.  All other systems reviewed and are negative.   Physical Exam Updated Vital Signs BP 119/72 (BP Location: Right Arm)   Pulse 60   Temp 97.6 F (36.4 C) (Oral)   Resp (!) 24   Ht 1.727 m (5\' 8" )   Wt 59 kg   SpO2 99%   BMI 19.77 kg/m   Physical Exam Vitals and nursing note reviewed.  Constitutional:      Appearance: He is well-developed and well-nourished. He is not ill-appearing.  HENT:     Head: Normocephalic and atraumatic.  Eyes:     Pupils: Pupils are equal, round, and reactive to light.  Cardiovascular:     Rate and Rhythm: Regular rhythm. Tachycardia present.     Heart sounds: Normal heart sounds. No murmur heard.   Pulmonary:     Effort: Pulmonary effort is normal. No respiratory distress.     Breath sounds: Normal breath sounds. No wheezing.  Abdominal:     General: Bowel sounds are  normal.     Palpations: Abdomen is soft.     Tenderness: There is no abdominal tenderness. There is no rebound.  Musculoskeletal:        General: No edema.     Cervical back: Neck supple.     Right lower leg: No tenderness. No edema.     Left lower leg: No tenderness. No edema.  Lymphadenopathy:     Cervical: No cervical adenopathy.  Skin:    General: Skin is warm and dry.  Neurological:     Mental Status: He is alert and oriented to person, place, and time.  Psychiatric:        Mood and Affect: Mood and affect and mood normal.     ED Results / Procedures / Treatments   Labs (all labs ordered are listed, but only abnormal results are displayed) Labs Reviewed  CBC WITH DIFFERENTIAL/PLATELET - Abnormal; Notable for the following components:      Result  Value   WBC 12.9 (*)    Platelets 417 (*)    Neutro Abs 9.3 (*)    Abs Immature Granulocytes 0.11 (*)    All other components within normal limits  BASIC METABOLIC PANEL - Abnormal; Notable for the following components:   Sodium 134 (*)    CO2 21 (*)    Glucose, Bld 199 (*)    BUN 55 (*)    Creatinine, Ser 1.61 (*)    GFR, Estimated 42 (*)    All other components within normal limits  D-DIMER, QUANTITATIVE (NOT AT [facility]) - Abnormal; Notable for the following components:   D-Dimer, Quant 0.89 (*)    All other components within normal limits  TROPONIN I (HIGH SENSITIVITY)  TROPONIN I (HIGH SENSITIVITY)    EKG EKG Interpretation  Date/Time:  Saturday January 03 2020 22:59:00 EST Ventricular Rate:  121 PR Interval:    QRS Duration: 74 QT Interval:  296 QTC Calculation: 420 R Axis:   31 Text Interpretation: Sinus tachycardia with irregular rate Abnormal R-wave progression, early transition Borderline repolarization abnormality Confirmed by Thayer Jew [phone]) on 01/03/2020 11:00:44 PM   Radiology DG Chest 2 View  Result Date: 01/03/2020 CLINICAL DATA:  Chest pain. EXAM: CHEST - 2 VIEW COMPARISON:  October 09, 2018 FINDINGS: Decreased lung volumes are seen which is likely, in part, secondary to the degree of patient inspiration. Mild to moderate severity diffuse chronic appearing increased interstitial lung markings are noted. Very mild atelectasis and/or early infiltrate is seen within the left lung base. There is no evidence of a pleural effusion or pneumothorax. The heart size and mediastinal contours are within normal limits. Degenerative changes seen throughout the thoracic spine. IMPRESSION: Chronic appearing increased interstitial lung markings with very mild left basilar atelectasis and/or early infiltrate. Electronically Signed   By: Virgina Norfolk M.D.   On: 01/03/2020 23:46   CT Angio Chest PE W and/or Wo Contrast  Result Date: 01/04/2020 CLINICAL DATA:   Chest pain insert 4 days ago after his wife passed away. currently has a shingles flare up on his "waist" EXAM: CT ANGIOGRAPHY CHEST WITH CONTRAST TECHNIQUE: Multidetector CT imaging of the chest was performed using the standard protocol during bolus administration of intravenous contrast. Multiplanar CT image reconstructions and MIPs were obtained to evaluate the vascular anatomy. CONTRAST:  7mL OMNIPAQUE IOHEXOL 350 MG/ML SOLN COMPARISON:  Chest x-ray 01/03/2020. FINDINGS: Cardiovascular: Satisfactory opacification of the pulmonary arteries to the segmental level. No evidence of pulmonary embolism. The main pulmonary artery is normal  in caliber. Normal heart size. No significant pericardial effusion. The thoracic aorta is normal in caliber. At least moderate atherosclerotic plaque of the thoracic aorta. At least mild4 vessel coronary artery calcifications. Mediastinum/Nodes: No enlarged mediastinal, hilar, or axillary lymph nodes. Thyroid gland, trachea, and esophagus demonstrate no significant findings. Lungs/Pleura: Mild scattered peripheral reticulations. Calcified granuloma (5:45). Subpleural right upper lobe 5 mm nodule along the right major fissure (5:41, 6:93). No pulmonary mass no focal consolidation. No pleural effusion. No pneumothorax. Upper Abdomen: No acute abnormality. Musculoskeletal: No abdominal wall hernia or abnormality No suspicious lytic or blastic osseous lesions. No acute displaced fracture. Multilevel degenerative changes of the spine. Review of the MIP images confirms the above findings. IMPRESSION: 1. No pulmonary embolus. 2. No acute intrathoracic abnormality in a patient with findings suggestive of mild pulmonary fibrosis. 3. A 5 mm subpleural right upper lobe nodule. No follow-up needed if patient is low-risk. Non-contrast chest CT can be considered in 12 months if patient is high-risk. This recommendation follows the consensus statement: Guidelines for Management of Incidental  Pulmonary Nodules Detected on CT Images: From the Fleischner Society 2017; Radiology 2017; 284:228-243. 4. Aortic Atherosclerosis (ICD10-I70.0) as well as at least mild four-vessel. Electronically Signed   By: Iven Finn M.D.   On: 01/04/2020 01:34    Procedures Procedures (including critical care time)  Medications Ordered in ED Medications  LORazepam (ATIVAN) injection 0.5 mg (0.5 mg Intravenous Given 01/03/20 2319)  sodium chloride 0.9 % bolus 500 mL (0 mLs Intravenous Stopped 01/04/20 0142)  iohexol (OMNIPAQUE) 350 MG/ML injection 80 mL (80 mLs Intravenous Contrast Given 01/04/20 0038)    ED Course  I have reviewed the triage vital signs and the nursing notes.  Pertinent labs & imaging results that were available during my care of the patient were reviewed by me and considered in my medical decision making (see chart for details).    MDM Rules/Calculators/A&P                          Patient presents with chest pain.  He is overall nontoxic-appearing and vital signs are reassuring.  Patient is intermittently tachycardic.  He is low risk for ACS with the exception of age and hypertension.  Pain has been ongoing.  EKG without acute ischemic or arrhythmic changes.  Labs obtained including screening D-dimer given tachycardia.  Patient also is currently grieving his wife.  Question some component of grief and anxiety.  Chest x-ray obtained and shows no evidence of pneumothorax or pneumonia.  Initial troponin is negative.  D-dimer slightly positive at 0.89.  Will obtain CT of the chest.  CT of the chest is negative for PE but does show a small pulmonary nodule.  Patient is low risk and has never smoked.  He was advised of this finding but does not need further work-up.  Repeat troponin is negative.  This is very reassuring.  Feel he is low risk enough for outpatient follow-up with his primary doctor and cardiology for possible stress testing.  After history, exam, and medical workup I feel  the patient has been appropriately medically screened and is safe for discharge home. Pertinent diagnoses were discussed with the patient. Patient was given return precautions.  Final Clinical Impression(s) / ED Diagnoses Final diagnoses:  Atypical chest pain  Grief    Rx / DC Orders ED Discharge Orders    None       Nat Lowenthal, Barbette Hair, MD 01/04/20 [phone]

## 2020-01-03 NOTE — ED Triage Notes (Addendum)
Pt c/o chest pain that started 4 days ago after his wife passed away. Denies any sob. Has not taken anything for the pain. Denies any n/v. States he currently has a shingles flare up on his "waist"

## 2020-01-04 ENCOUNTER — Encounter (HOSPITAL_BASED_OUTPATIENT_CLINIC_OR_DEPARTMENT_OTHER): Payer: Self-pay

## 2020-01-04 ENCOUNTER — Emergency Department (HOSPITAL_BASED_OUTPATIENT_CLINIC_OR_DEPARTMENT_OTHER): Payer: Medicare HMO

## 2020-01-04 LAB — TROPONIN I (HIGH SENSITIVITY)
Troponin I (High Sensitivity): 11 ng/L (ref <18)
Troponin I (High Sensitivity): 10 ng/L (ref <18)

## 2020-01-04 MED ORDER — SODIUM CHLORIDE 0.9 % IV BOLUS
500.0000 mL | Freq: Once | INTRAVENOUS | Status: AC
  Administered 2020-01-04: 500 mL via INTRAVENOUS

## 2020-01-04 MED ORDER — IOHEXOL 350 MG/ML SOLN
80.0000 mL | Freq: Once | INTRAVENOUS | Status: AC | PRN
  Administered 2020-01-04: 80 mL via INTRAVENOUS

## 2020-01-04 NOTE — Discharge Instructions (Addendum)
You were seen today for chest pain.  Your work-up is reassuring.  Your heart testing is negative.  Your CT scan does show a small nodule.  If you have a history of smoking, you need a repeat CT scan in 12 months.  Follow-up with your primary physician for ongoing symptoms.

## 2020-01-04 NOTE — ED Notes (Signed)
Pt discharged, wants to speak to EDP about something to increase appetite; EDP aware, will leave pt in room for EDP

## 2020-01-30 DIAGNOSIS — N400 Enlarged prostate without lower urinary tract symptoms: Secondary | ICD-10-CM | POA: Insufficient documentation

## 2020-02-03 ENCOUNTER — Emergency Department (HOSPITAL_COMMUNITY)
Admission: EM | Admit: 2020-02-03 | Discharge: 2020-02-03 | Disposition: A | Discharge status: Home / Self Care | Payer: Medicare HMO | Attending: Emergency Medicine | Admitting: Emergency Medicine

## 2020-02-03 ENCOUNTER — Encounter (HOSPITAL_COMMUNITY): Payer: Self-pay

## 2020-02-03 ENCOUNTER — Emergency Department (HOSPITAL_COMMUNITY): Payer: Medicare HMO

## 2020-02-03 DIAGNOSIS — I4891 Unspecified atrial fibrillation: Secondary | ICD-10-CM | POA: Insufficient documentation

## 2020-02-03 DIAGNOSIS — E860 Dehydration: Secondary | ICD-10-CM

## 2020-02-03 DIAGNOSIS — I10 Essential (primary) hypertension: Secondary | ICD-10-CM | POA: Diagnosis not present

## 2020-02-03 DIAGNOSIS — N179 Acute kidney failure, unspecified: Secondary | ICD-10-CM

## 2020-02-03 DIAGNOSIS — Z79899 Other long term (current) drug therapy: Secondary | ICD-10-CM | POA: Insufficient documentation

## 2020-02-03 DIAGNOSIS — E875 Hyperkalemia: Secondary | ICD-10-CM

## 2020-02-03 DIAGNOSIS — R55 Syncope and collapse: Secondary | ICD-10-CM | POA: Diagnosis present

## 2020-02-03 LAB — CBC
HCT: 47.8 % (ref 39.0–52.0)
Hemoglobin: 15.4 g/dL (ref 13.0–17.0)
MCH: 31.1 pg (ref 26.0–34.0)
MCHC: 32.2 g/dL (ref 30.0–36.0)
MCV: 96.6 fL (ref 80.0–100.0)
Platelets: 396 10*3/uL (ref 150–400)
RBC: 4.95 MIL/uL (ref 4.22–5.81)
RDW: 12.1 % (ref 11.5–15.5)
WBC: 11.3 10*3/uL — ABNORMAL HIGH (ref 4.0–10.5)
nRBC: 0 % (ref 0.0–0.2)

## 2020-02-03 LAB — COMPREHENSIVE METABOLIC PANEL
ALT: 22 U/L (ref 0–44)
AST: 20 U/L (ref 15–41)
Albumin: 3 g/dL — ABNORMAL LOW (ref 3.5–5.0)
Alkaline Phosphatase: 71 U/L (ref 38–126)
Anion gap: 16 — ABNORMAL HIGH (ref 5–15)
BUN: 80 mg/dL — ABNORMAL HIGH (ref 8–23)
CO2: 22 mmol/L (ref 22–32)
Calcium: 9.6 mg/dL (ref 8.9–10.3)
Chloride: 100 mmol/L (ref 98–111)
Creatinine, Ser: 2.84 mg/dL — ABNORMAL HIGH (ref 0.61–1.24)
GFR, Estimated: 21 mL/min — ABNORMAL LOW (ref >60)
Glucose, Bld: 140 mg/dL — ABNORMAL HIGH (ref 70–99)
Potassium: 5.5 mmol/L — ABNORMAL HIGH (ref 3.5–5.1)
Sodium: 138 mmol/L (ref 135–145)
Total Bilirubin: 0.6 mg/dL (ref 0.3–1.2)
Total Protein: 6.5 g/dL (ref 6.5–8.1)

## 2020-02-03 MED ORDER — SODIUM CHLORIDE 0.9 % IV SOLN: Freq: Once | INTRAVENOUS | Status: DC

## 2020-02-03 MED ORDER — SODIUM CHLORIDE 0.9 % IV BOLUS
1000.0000 mL | Freq: Once | INTRAVENOUS | Status: AC
  Administered 2020-02-03: 1000 mL via INTRAVENOUS

## 2020-02-03 NOTE — ED Provider Notes (Addendum)
Signout note  87 year old gentleman with syncopal office at physician office.  Initial report of A. fib on monitor, EKG showing sinus tachycardia.  Basic labs, fluids have been ordered.  3:14 PM Received signout from Dr. Albertine Patricia, plan to reassess patient after labs and fluids have been given  5:46 PM On reassessment, patient is well-appearing in no acute distress with stable vitals.  I reviewed labs, showing acute kidney injury, borderline hyperkalemia, elevated BUN.  Suspect significant dehydration.  Lengthy discussion with patient and daughter at bedside regarding goals of care.  Patient states that he does not want aggressive medical care and does not want hospitalization.  Both patient and daughter state that he would like to pursue hospice care and comfort measures only.  I discussed in detail that I would ordinarily recommend admission for observation, rehydration given the syncopal episode and laboratory values, discussed potential progression of kidney disease leading to worsening encephalopathy and hyperkalemia, cardiac arrest and even death.  Patient and daughter acknowledged these concerns and state they are comfortable with these risks and do not want hospitalizations or any form of aggressive medical care.  Patient is grieving loss of wife in early December however after exploring this further, he is pleasant, if anything demonstrates sense of peace, appears to be of sound mind and sound judgment; I believe he has decision-making capacity. I recommended stopping lisinopril in setting of AKI and encouraging PO hydration. I recommended calling back PCP to discuss referral for palliative care. Consult to CM to request home health resources.      Lucrezia Starch, MD 02/03/20 1754

## 2020-02-03 NOTE — Discharge Instructions (Addendum)
You should stop taking your lisinopril for now.  You should only restart it if recommended by your primary doctor.  You may return to ER at anytime if you change your mind or you develop any other new concerning symptom.  Rest. Drink plenty of fluids. Get adequate nutrition.  I would recommend calling your doctor to schedule a close appointment to discuss ongoing goals of care, referrals for palliative and hospice care.

## 2020-02-03 NOTE — Care Management (Signed)
ED CM noted and will follow up.

## 2020-02-03 NOTE — ED Notes (Signed)
MD at bedside. 

## 2020-02-03 NOTE — ED Triage Notes (Signed)
Brought to ED by [facility] from pts Dr office. Pt passed out while being seen. Pt denies any pain or concerns at this time. Afib on the monitor.

## 2020-02-03 NOTE — ED Notes (Signed)
Daughter at bedside. Pt wants to go home.

## 2020-02-03 NOTE — ED Provider Notes (Signed)
[facility] DEPARTMENT Provider Note   CSN: [id number] Arrival date & time: 02/03/20  1352     History Chief Complaint  Patient presents with  . Loss of Consciousness    "LOC in Dr's office", was being seen for decreased eating and drinking since wife died 8 days ago.    Wendi Maya Sr. is a 87 y.o. male.  Patient presents via EMS c/o generalized weakness, and transient syncopal event. Pt was at doctors office, when says momentarily passed out. He denies any preceding or associated chest pain or discomfort. No palpitations or sense of rapid or irregular heart beat. Denies injury w event. Denies headache, no neck or back pain. No abd pain or nvd. States his wife of 60+ years passed away in past month, states since then relatively poor po intake. Denies  Fever or chills. No blood loss, rectal bleeding or melena. Denies new meds. States has taken his bp meds per normal.   The history is provided by the patient.  Loss of Consciousness Associated symptoms: no chest pain, no confusion, no fever, no headaches, no palpitations, no shortness of breath and no vomiting        Past Medical History:  Diagnosis Date  . Hypertension     There are no problems to display for this patient.   Past Surgical History:  Procedure Laterality Date  . APPENDECTOMY    . HEMORROIDECTOMY    . TONSILLECTOMY         History reviewed. No pertinent family history.  Social History   Tobacco Use  . Smoking status: Never Smoker  . Smokeless tobacco: Never Used  Substance Use Topics  . Alcohol use: Not Currently  . Drug use: Never    Home Medications Prior to Admission medications   Medication Sig Start Date End Date Taking? Authorizing Provider  lisinopril (ZESTRIL) 10 MG tablet Take 10 mg by mouth daily.    [provider]  metoprolol tartrate (LOPRESSOR) 25 MG tablet Take 25 mg by mouth 2 (two) times daily.    [provider]  valACYclovir  (VALTREX) 1000 MG tablet Take 1 tablet (1,000 mg total) by mouth 3 (three) times daily. 07/20/19   Dorie Rank, MD    Allergies    Patient has no known allergies.  Review of Systems   Review of Systems  Constitutional: Negative for fever.  HENT: Negative for sore throat.   Eyes: Negative for visual disturbance.  Respiratory: Negative for cough and shortness of breath.   Cardiovascular: Positive for syncope. Negative for chest pain, palpitations and leg swelling.  Gastrointestinal: Negative for abdominal pain, blood in stool, diarrhea and vomiting.  Genitourinary: Negative for dysuria and flank pain.  Musculoskeletal: Negative for back pain and neck pain.  Skin: Negative for rash.  Neurological: Negative for speech difficulty, numbness and headaches.  Hematological: Does not bruise/bleed easily.  Psychiatric/Behavioral: Negative for confusion.    Physical Exam Updated Vital Signs BP 105/65   Pulse (!) 119   Temp 98.1 F (36.7 C) (Oral)   Resp (!) 27   SpO2 95%   Physical Exam Vitals and nursing note reviewed.  Constitutional:      Appearance: Normal appearance. He is well-developed.  HENT:     Head: Atraumatic.     Nose: Nose normal.     Mouth/Throat:     Mouth: Mucous membranes are moist.     Pharynx: Oropharynx is clear.  Eyes:     General: No scleral icterus.  Conjunctiva/sclera: Conjunctivae normal.     Pupils: Pupils are equal, round, and reactive to light.  Neck:     Vascular: No carotid bruit.     Trachea: No tracheal deviation.  Cardiovascular:     Rate and Rhythm: Tachycardia present. Rhythm irregular.     Pulses: Normal pulses.     Heart sounds: Normal heart sounds. No murmur heard. No friction rub. No gallop.   Pulmonary:     Effort: Pulmonary effort is normal. No accessory muscle usage or respiratory distress.     Breath sounds: Normal breath sounds.  Chest:     Chest wall: No tenderness.  Abdominal:     General: Bowel sounds are normal. There is  no distension.     Palpations: Abdomen is soft. There is no mass.     Tenderness: There is no abdominal tenderness. There is no guarding.  Genitourinary:    Comments: No cva tenderness. Musculoskeletal:        General: No swelling.     Cervical back: Normal range of motion and neck supple. No rigidity.     Comments: CTLS spine, non tender, aligned, no step off. Good rom bilateral extremities without pain or focal bony tenderness.   Skin:    General: Skin is warm and dry.     Findings: No rash.  Neurological:     Mental Status: He is alert.     Comments: Alert, speech clear. Motor/sens grossly intact bil.   Psychiatric:        Mood and Affect: Mood normal.     ED Results / Procedures / Treatments   Labs (all labs ordered are listed, but only abnormal results are displayed) Results for orders placed or performed during the hospital encounter of 01/03/20  CBC with Differential  Result Value Ref Range   WBC 12.9 (H) 4.0 - 10.5 K/uL   RBC 4.87 4.22 - 5.81 MIL/uL   Hemoglobin 15.3 13.0 - 17.0 g/dL   HCT 45.1 39.0 - 52.0 %   MCV 92.6 80.0 - 100.0 fL   MCH 31.4 26.0 - 34.0 pg   MCHC 33.9 30.0 - 36.0 g/dL   RDW 12.2 11.5 - 15.5 %   Platelets 417 (H) 150 - 400 K/uL   nRBC 0.0 0.0 - 0.2 %   Neutrophils Relative % 72 %   Neutro Abs 9.3 (H) 1.7 - 7.7 K/uL   Lymphocytes Relative 19 %   Lymphs Abs 2.4 0.7 - 4.0 K/uL   Monocytes Relative 7 %   Monocytes Absolute 0.9 0.1 - 1.0 K/uL   Eosinophils Relative 1 %   Eosinophils Absolute 0.1 0.0 - 0.5 K/uL   Basophils Relative 0 %   Basophils Absolute 0.0 0.0 - 0.1 K/uL   Immature Granulocytes 1 %   Abs Immature Granulocytes 0.11 (H) 0.00 - 0.07 K/uL  Basic metabolic panel  Result Value Ref Range   Sodium 134 (L) 135 - 145 mmol/L   Potassium 4.2 3.5 - 5.1 mmol/L   Chloride 101 98 - 111 mmol/L   CO2 21 (L) 22 - 32 mmol/L   Glucose, Bld 199 (H) 70 - 99 mg/dL   BUN 55 (H) 8 - 23 mg/dL   Creatinine, Ser 1.61 (H) 0.61 - 1.24 mg/dL    Calcium 9.3 8.9 - 10.3 mg/dL   GFR, Estimated 42 (L) >60 mL/min   Anion gap 12 5 - 15  D-dimer, quantitative (not at [facility])  Result Value Ref Range   D-Dimer, Quant 0.89 (  H) 0.00 - 0.50 ug/mL-FEU  Troponin I (High Sensitivity)  Result Value Ref Range   Troponin I (High Sensitivity) 11 <18 ng/L  Troponin I (High Sensitivity)  Result Value Ref Range   Troponin I (High Sensitivity) 10 <18 ng/L    EKG EKG Interpretation  Date/Time:  Tuesday February 03 2020 14:13:43 EST Ventricular Rate:  110 PR Interval:    QRS Duration: 82 QT Interval:  307 QTC Calculation: 416 R Axis:   31 Text Interpretation: Sinus tachycardia Multiple premature complexes, vent & supraven No significant change since last tracing Confirmed by Lajean Saver [id number] on 02/03/2020 2:19:49 PM   Radiology No results found.  Procedures Procedures   Medications Ordered in ED Medications - No data to display  ED Course  I have reviewed the triage vital signs and the nursing notes.  Pertinent labs & imaging results that were available during my care of the patient were reviewed by me and considered in my medical decision making (see chart for details).    MDM Rules/Calculators/A&P                          Iv ns. Continuous pulse ox and cardiac monitoring. Ecg. Stat labs.   Reviewed nursing notes and prior charts for additional history.   Pt notes recent very poor po intake. Iv ns bolus. Po fluids/food.   1520, labs pending - signed pt out to Dr Roslynn Amble to check labs, recheck pt/vitals, and dispo appropriately.    Final Clinical Impression(s) / ED Diagnoses Final diagnoses:  None    Rx / DC Orders ED Discharge Orders    None       Lajean Saver, MD 02/03/20 1520

## 2020-04-15 ENCOUNTER — Ambulatory Visit: Payer: Medicare HMO | Admitting: Cardiology

## 2020-04-16 ENCOUNTER — Ambulatory Visit: Payer: Medicare HMO | Admitting: Cardiology

## 2020-04-19 DIAGNOSIS — I10 Essential (primary) hypertension: Secondary | ICD-10-CM | POA: Insufficient documentation

## 2020-04-22 NOTE — Progress Notes (Signed)
Cardiology Office Note:    Date:  04/23/2020   ID:  David Maya Sr., DOB 01-16-1932, MRN [id number]  PCP:  Virginia Rochester, [place]  Cardiologist:  Shirlee More, MD   Referring MD: Virginia Rochester, [place]  ASSESSMENT:    1. Paroxysmal atrial fibrillation (HCC)   2. Chronic kidney disease (CKD), stage IV (severe) ([place])   3. Benign hypertension with CKD (chronic kidney disease) stage IV (HCC)   4. Pulmonary fibrosis (HCC)    PLAN:    In order of problems listed above:  1. I am unsure of the veracity of this diagnosis its not documented Apply a 7-day ZIO monitor if he has documented atrial fibrillation would benefit from anticoagulation to prevent stroke and preserve the quality of his life.  In the interim continue his minimal dose of beta-blocker 2. In retrospect I think what he had was acute renal failure may have been contrast-induced he is off ACE inhibitor I would not restart it but blood pressures out of range and will place him on low-dose calcium channel blocker and I asked him to take his diuretic 2 days a week 3. Check echocardiogram I do not think clinically he has heart failure and will continue to avoid metolazone  Next appointment 3 months    Medication Adjustments/Labs and Tests Ordered: Current medicines are reviewed at length with the patient today.  Concerns regarding medicines are outlined above.  No orders of the defined types were placed in this encounter.  No orders of the defined types were placed in this encounter.    He was advised to see a cardiologist when he was discharged from hospice  History of Present Illness:    David Gravois Sr. is a 87 y.o. male who is being seen today to establish cardiology [facility] the request of [place], [place], [place].  Chart review shows that he is seen in the ED 02/03/2020 after syncope he had paroxysmal atrial fibrillation hyperkalemia renal insufficiency and the patient did not want  hospitalization or aggressive medical treatment.  His creatinine was 2.84 GFR 21 cc potassium 5.5 hemoglobin 15.4 and his EKG that data independently reviewed showed sinus tachycardia 110 bpm with APCs otherwise normal.  Primary care records office visit 01/13/2020 list his diagnoses as hypertension consistent with diabetes type 2 diabetes with stage IIIa CKD hyperlipidemia chest pain and grief reaction.  At that time patient and family did not feel he was having chest pain and cardiology consultation was canceled.  He had a CT of the chest performed 01/04/2020 with no finding of pulmonary embolism.  Other findings included pulmonary nodule and mild pulmonary fibrosis.  He had mild diffuse coronary artery calcification.  Before the visit I had called hospice requested records and spoke to one of their nursing staff. He is admitted with stage IV to stage V CKD and is recovered During the process received high-dose steroids and developed fluid overload and was taking diuretics. He has no diagnosis of heart failure. I reviewed all the ED EKGs and the best described as sinus rhythm frequent atrial premature contractions and brief runs of APCs but no episodes of atrial fibrillation. He is taking diuretic as needed but has not done recently When he joined hospice his antihypertensives ACE inhibitor calcium channel blocker were stopped He remains committed to quality of life is cared for by his daughter at home is able to ambulate with a walker but is very bothered by the fact that he  is lost control of his bowel and bladder. No chest pain orthopnea shortness of breath or recurrent syncope. His initial presentation occurred in the setting of losing his wife and profound grief Complains bitterly of pain in the right knee and I asked him to discuss with his PCP steroid injection for palliation  Past Medical History:  Diagnosis Date  . Hypertension     Past Surgical History:  Procedure Laterality Date   . APPENDECTOMY    . HEMORROIDECTOMY    . TONSILLECTOMY      Current Medications: Current Meds  Medication Sig  . haloperidol (HALDOL) 0.5 MG tablet Take 0.5 mg by mouth every 4 (four) hours as needed for nausea/vomiting or agitation.  Marland Kitchen HYDROmorphone (DILAUDID) 2 MG tablet Take 0.5 tablets by mouth every 2 (two) hours as needed for pain or shortness of breath.  Marland Kitchen ibuprofen (ADVIL) 200 MG tablet Take 400 mg by mouth in the morning and at bedtime.  Marland Kitchen LORazepam (ATIVAN) 0.5 MG tablet Take 0.5 mg by mouth every 4 (four) hours as needed for anxiety.  . metoprolol succinate (TOPROL-XL) 25 MG 24 hr tablet Take 12.5 mg by mouth daily.  . mirtazapine (REMERON) 7.5 MG tablet Take 7.5 mg by mouth at bedtime.  Marland Kitchen nystatin (MYCOSTATIN/NYSTOP) powder Apply 1 application topically 2 (two) times daily.  Marland Kitchen omeprazole (PRILOSEC) 20 MG capsule Take 20 mg by mouth daily.  Marland Kitchen senna-docusate (SENOKOT-S) 8.6-50 MG tablet Take 1-2 tablets by mouth daily as needed for constipation.  . [DISCONTINUED] furosemide (LASIX) 40 MG tablet Take 40 mg by mouth 2 (two) times a week. Tues and Friday  . [DISCONTINUED] metolazone (ZAROXOLYN) 2.5 MG tablet Take 2.5 mg by mouth daily as needed. Tues and Friday  . [DISCONTINUED] Potassium Chloride (KLOR-CON 10 PO) Take 10 mEq by mouth 2 (two) times a week. Tues and Friday     Allergies:   Patient has no known allergies.   Social History   Socioeconomic History  . Marital status: Unknown    Spouse name: Not on file  . Number of children: Not on file  . Years of education: Not on file  . Highest education level: Not on file  Occupational History  . Not on file  Tobacco Use  . Smoking status: Never Smoker  . Smokeless tobacco: Never Used  Substance and Sexual Activity  . Alcohol use: Not Currently  . Drug use: Never  . Sexual activity: Not on file  Other Topics Concern  . Not on file  Social History Narrative  . Not on file   Social Determinants of Health    Financial Resource Strain: Not on file  Food Insecurity: Not on file  Transportation Needs: Not on file  Physical Activity: Not on file  Stress: Not on file  Social Connections: Not on file     Family History: The patient's family history includes Heart attack in his father; Heart attack (age of onset: 71) in his mother.  ROS:   ROS Please see the history of present illness.     All other systems reviewed and are negative.  EKGs/Labs/Other Studies Reviewed:    The following studies were reviewed today:  Recent Labs:  03/23/2020 creatinine 0.86 potassium 3.8 02/03/2020: ALT 22; BUN 80; Creatinine, Ser 2.84; Hemoglobin 15.4; Platelets 396; Potassium 5.5; Sodium 138  Recent Lipid Panel No results found for: CHOL, TRIG, HDL, CHOLHDL, VLDL, LDLCALC, LDLDIRECT  Physical Exam:    VS:  BP (!) 160/10 (BP Location: Left Arm, Patient  Position: Sitting)   Pulse (!) 50   Ht '5\' 8"'$  (1.727 m)   Wt 185 lb (83.9 kg)   SpO2 96%   BMI 28.13 kg/m     Wt Readings from Last 3 Encounters:  04/23/20 185 lb (83.9 kg)  01/03/20 130 lb (59 kg)  07/20/19 195 lb (88.5 kg)     GEN:  Well nourished, well developed in no acute distress HEENT: Normal NECK: No JVD; No carotid bruits LYMPHATICS: No lymphadenopathy CARDIAC: RRR, no murmurs, rubs, gallops RESPIRATORY:  Clear to auscultation without rales, wheezing or rhonchi  ABDOMEN: Soft, non-tender, non-distended MUSCULOSKELETAL: He has 1+ bilateral lower extremity edema; No deformity  SKIN: Warm and dry NEUROLOGIC:  Alert and oriented x 3 PSYCHIATRIC:  Normal affect     Signed, Shirlee More, MD  04/23/2020 3:19 PM    [facility]

## 2020-04-23 ENCOUNTER — Ambulatory Visit (INDEPENDENT_AMBULATORY_CARE_PROVIDER_SITE_OTHER): Payer: Medicare HMO | Admitting: Cardiology

## 2020-04-23 ENCOUNTER — Ambulatory Visit (INDEPENDENT_AMBULATORY_CARE_PROVIDER_SITE_OTHER): Payer: Medicare HMO

## 2020-04-23 ENCOUNTER — Other Ambulatory Visit: Payer: Self-pay

## 2020-04-23 ENCOUNTER — Encounter: Payer: Self-pay | Admitting: Cardiology

## 2020-04-23 VITALS — BP 160/10 | HR 50 | Ht 68.0 in | Wt 185.0 lb

## 2020-04-23 DIAGNOSIS — I129 Hypertensive chronic kidney disease with stage 1 through stage 4 chronic kidney disease, or unspecified chronic kidney disease: Secondary | ICD-10-CM

## 2020-04-23 DIAGNOSIS — I480 Paroxysmal atrial fibrillation: Secondary | ICD-10-CM

## 2020-04-23 DIAGNOSIS — N184 Chronic kidney disease, stage 4 (severe): Secondary | ICD-10-CM | POA: Diagnosis not present

## 2020-04-23 DIAGNOSIS — J8410 Pulmonary fibrosis, unspecified: Secondary | ICD-10-CM | POA: Diagnosis not present

## 2020-04-23 MED ORDER — FUROSEMIDE 40 MG PO TABS
40.0000 mg | ORAL_TABLET | ORAL | 3 refills | Status: DC
Start: 2020-04-26 — End: 2021-06-28

## 2020-04-23 NOTE — Patient Instructions (Signed)
Medication Instructions:  Your physician has recommended you make the following change in your medication:  START: Amlodipine 5 mg take one tablet by mouth daily.   Please take your furosemide two times a week.  *If you need a refill on your cardiac medications before your next appointment, please call your pharmacy*   Lab Work: None If you have labs (blood work) drawn today and your tests are completely normal, you will receive your results only by: Marland Kitchen MyChart Message (if you have MyChart) OR . A paper copy in the mail If you have any lab test that is abnormal or we need to change your treatment, we will call you to review the results.   Testing/Procedures: Your physician has requested that you have an echocardiogram. Echocardiography is a painless test that uses sound waves to create images of your heart. It provides your doctor with information about the size and shape of your heart and how well your heart's chambers and valves are working. This procedure takes approximately one hour. There are no restrictions for this procedure.  A zio monitor was ordered today. It will remain on for 7 days. You will then return monitor and event diary in provided box. It takes 1-2 weeks for report to be downloaded and returned to [place]. We will call you with the results. If monitor falls off or has orange flashing light, please call Zio for further instructions.      Follow-Up: At [facility], you and your health needs are our priority.  As part of our continuing mission to provide you with exceptional heart care, we have created designated Provider Care Teams.  These Care Teams include your primary Cardiologist (physician) and Advanced Practice Providers (APPs -  Physician Assistants and Nurse Practitioners) who all work together to provide you with the care you need, when you need it.  We recommend signing up for the patient portal called "MyChart".  Sign up information is provided on this After  Visit Summary.  MyChart is used to connect with patients for Virtual Visits (Telemedicine).  Patients are able to view lab/test results, encounter notes, upcoming appointments, etc.  Non-urgent messages can be sent to your provider as well.   To learn more about what you can do with MyChart, go to [email or web address].    Your next appointment:   3 month(s)  The format for your next appointment:   In Person  Provider:   Shirlee More, MD   Other Instructions

## 2020-04-29 ENCOUNTER — Telehealth: Payer: Self-pay | Admitting: Family Medicine

## 2020-04-29 NOTE — Telephone Encounter (Signed)
David Nichols  is calling for her dad wand wants if he can come over as a new patient. He was referred by  Dr. Bettina Gavia. Please advise.

## 2020-04-29 NOTE — Telephone Encounter (Signed)
OK 

## 2020-05-10 MED ORDER — MIRTAZAPINE 7.5 MG PO TABS: 7.5000 mg | ORAL_TABLET | Freq: Every day | ORAL | 1 refills | Status: DC

## 2020-05-17 ENCOUNTER — Telehealth: Payer: Self-pay

## 2020-05-17 NOTE — Telephone Encounter (Signed)
-----   Message from Richardo Priest, MD sent at 05/15/2020  1:38 PM EDT ----- His monitor shows frequent atrial and ventricular premature beats however no episodes of atrial fibrillation or flutter.  I do not think he needs to be taking a blood thinner or anticoagulant at this time.  Continue his beta-blocker metoprolol.  Please ask if he has been taking haloperidol.  I prefer if he did not take this drug with his extra beats.

## 2020-05-17 NOTE — Telephone Encounter (Signed)
Spoke with patient regarding results and recommendation.  Patient verbalizes understanding and is agreeable to plan of care. Advised patient to call back with any issues or concerns.  

## 2020-05-18 ENCOUNTER — Ambulatory Visit: Payer: Medicare HMO | Admitting: Family Medicine

## 2020-05-28 ENCOUNTER — Encounter: Payer: Self-pay | Admitting: Family Medicine

## 2020-05-28 ENCOUNTER — Other Ambulatory Visit: Payer: Self-pay

## 2020-05-28 ENCOUNTER — Ambulatory Visit (INDEPENDENT_AMBULATORY_CARE_PROVIDER_SITE_OTHER): Payer: Medicare HMO | Admitting: Family Medicine

## 2020-05-28 VITALS — BP 142/78 | HR 52 | Temp 98.0°F | Resp 18 | Wt 181.8 lb

## 2020-05-28 DIAGNOSIS — R32 Unspecified urinary incontinence: Secondary | ICD-10-CM

## 2020-05-28 DIAGNOSIS — B0229 Other postherpetic nervous system involvement: Secondary | ICD-10-CM

## 2020-05-28 DIAGNOSIS — S02401A Maxillary fracture, unspecified, initial encounter for closed fracture: Secondary | ICD-10-CM | POA: Insufficient documentation

## 2020-05-28 DIAGNOSIS — E1169 Type 2 diabetes mellitus with other specified complication: Secondary | ICD-10-CM | POA: Diagnosis not present

## 2020-05-28 DIAGNOSIS — M1711 Unilateral primary osteoarthritis, right knee: Secondary | ICD-10-CM

## 2020-05-28 DIAGNOSIS — E785 Hyperlipidemia, unspecified: Secondary | ICD-10-CM

## 2020-05-28 MED ORDER — OXYBUTYNIN CHLORIDE ER 5 MG PO TB24: 5.0000 mg | ORAL_TABLET | Freq: Every day | ORAL | 1 refills | Status: DC

## 2020-05-28 MED ORDER — DEXAMETHASONE 2 MG PO TABS
1.0000 mg | ORAL_TABLET | ORAL | 2 refills | Status: DC
Start: 2020-05-28 — End: 2020-12-28

## 2020-05-28 MED ORDER — VENLAFAXINE HCL ER 37.5 MG PO CP24: 37.5000 mg | ORAL_CAPSULE | Freq: Every day | ORAL | 2 refills | Status: DC

## 2020-05-28 MED ORDER — AMLODIPINE BESYLATE 5 MG PO TABS: 5.0000 mg | ORAL_TABLET | Freq: Every day | ORAL | 2 refills | Status: DC

## 2020-05-28 NOTE — Patient Instructions (Addendum)
OK to take Tylenol 1000 mg (2 extra strength tabs) or 975 mg (3 regular strength tabs) every 6 hours as needed.  Ice/cold pack over area for 10-15 min twice daily.  Heat (pad or rice pillow in microwave) over affected area, 10-15 minutes twice daily.   Stay hydrated.   Try Metamucil daily.   Let us know if you need anything.  Knee Exercises It is normal to feel mild stretching, pulling, tightness, or discomfort as you do these exercises, but you should stop right away if you feel sudden pain or your pain gets worse. STRETCHING AND RANGE OF MOTION EXERCISES  These exercises warm up your muscles and joints and improve the movement and flexibility of your knee. These exercises also help to relieve pain, numbness, and tingling. Exercise A: Knee Extension, Prone  1. Lie on your abdomen on a bed. 2. Place your left / right knee just beyond the edge of the surface so your knee is not on the bed. You can put a towel under your left / right thigh just above your knee for comfort. 3. Relax your leg muscles and allow gravity to straighten your knee. You should feel a stretch behind your left / right knee. 4. Hold this position for 30 seconds. 5. Scoot up so your knee is supported between repetitions. Repeat 2 times. Complete this stretch 3 times per week. Exercise B: Knee Flexion, Active    1. Lie on your back with both knees straight. If this causes back discomfort, bend your left / right knee so your foot is flat on the floor. 2. Slowly slide your left / right heel back toward your buttocks until you feel a gentle stretch in the front of your knee or thigh. 3. Hold this position for 30 seconds. 4. Slowly slide your left / right heel back to the starting position. Repeat 2 times. Complete this exercise 3 times per week. Exercise C: Quadriceps, Prone    1. Lie on your abdomen on a firm surface, such as a bed or padded floor. 2. Bend your left / right knee and hold your ankle. If you cannot  reach your ankle or pant leg, loop a belt around your foot and grab the belt instead. 3. Gently pull your heel toward your buttocks. Your knee should not slide out to the side. You should feel a stretch in the front of your thigh and knee. 4. Hold this position for 30 seconds. Repeat 2 times. Complete this stretch 3 times per week. Exercise D: Hamstring, Supine  1. Lie on your back. 2. Loop a belt or towel over the ball of your left / right foot. The ball of your foot is on the walking surface, right under your toes. 3. Straighten your left / right knee and slowly pull on the belt to raise your leg until you feel a gentle stretch behind your knee. ? Do not let your left / right knee bend while you do this. ? Keep your other leg flat on the floor. 4. Hold this position for 30 seconds. Repeat 2 times. Complete this stretch 3 times per week. STRENGTHENING EXERCISES  These exercises build strength and endurance in your knee. Endurance is the ability to use your muscles for a long time, even after they get tired. Exercise E: Quadriceps, Isometric    1. Lie on your back with your left / right leg extended and your other knee bent. Put a rolled towel or small pillow under your knee if  told by your health care provider. 2. Slowly tense the muscles in the front of your left / right thigh. You should see your kneecap slide up toward your hip or see increased dimpling just above the knee. This motion will push the back of the knee toward the floor. 3. For 3 seconds, keep the muscle as tight as you can without increasing your pain. 4. Relax the muscles slowly and completely. Repeat for 10 total reps Repeat 2 ti mes. Complete this exercise 3 times per week. Exercise F: Straight Leg Raises - Quadriceps  1. Lie on your back with your left / right leg extended and your other knee bent. 2. Tense the muscles in the front of your left / right thigh. You should see your kneecap slide up or see increased  dimpling just above the knee. Your thigh may even shake a bit. 3. Keep these muscles tight as you raise your leg 4-6 inches (10-15 cm) off the floor. Do not let your knee bend. 4. Hold this position for 3 seconds. 5. Keep these muscles tense as you lower your leg. 6. Relax your muscles slowly and completely after each repetition. 10 total reps. Repeat 2 times. Complete this exercise 3 times per week.  Exercise G: Hamstring Curls    If told by your health care provider, do this exercise while wearing ankle weights. Begin with 5 lb weights (optional). Then increase the weight by 1 lb (0.5 kg) increments. Do not wear ankle weights that are more than 20 lbs to start with. 1. Lie on your abdomen with your legs straight. 2. Bend your left / right knee as far as you can without feeling pain. Keep your hips flat against the floor. 3. Hold this position for 3 seconds. 4. Slowly lower your leg to the starting position. Repeat for 10 reps.  Repeat 2 times. Complete this exercise 3 times per week. Exercise H: Squats (Quadriceps)  1. Stand in front of a table, with your feet and knees pointing straight ahead. You may rest your hands on the table for balance but not for support. 2. Slowly bend your knees and lower your hips like you are going to sit in a chair. ? Keep your weight over your heels, not over your toes. ? Keep your lower legs upright so they are parallel with the table legs. ? Do not let your hips go lower than your knees. ? Do not bend lower than told by your health care provider. ? If your knee pain increases, do not bend as low. 3. Hold the squat position for 1 second. 4. Slowly push with your legs to return to standing. Do not use your hands to pull yourself to standing. Repeat 2 times. Complete this exercise 3 times per week. Exercise I: Wall Slides (Quadriceps)    1. Lean your back against a smooth wall or door while you walk your feet out 18-24 inches (46-61 cm) from  it. 2. Place your feet hip-width apart. 3. Slowly slide down the wall or door until your knees Repeat 2 times. Complete this exercise every other day. 4. Exercise K: Straight Leg Raises - Hip Abductors  1. Lie on your side with your left / right leg in the top position. Lie so your head, shoulder, knee, and hip line up. You may bend your bottom knee to help you keep your balance. 2. Roll your hips slightly forward so your hips are stacked directly over each other and your left / right  knee is facing forward. 3. Leading with your heel, lift your top leg 4-6 inches (10-15 cm). You should feel the muscles in your outer hip lifting. ? Do not let your foot drift forward. ? Do not let your knee roll toward the ceiling. 4. Hold this position for 3 seconds. 5. Slowly return your leg to the starting position. 6. Let your muscles relax completely after each repetition. 10 total reps. Repeat 2 times. Complete this exercise 3 times per week. Exercise J: Straight Leg Raises - Hip Extensors  1. Lie on your abdomen on a firm surface. You can put a pillow under your hips if that is more comfortable. 2. Tense the muscles in your buttocks and lift your left / right leg about 4-6 inches (10-15 cm). Keep your knee straight as you lift your leg. 3. Hold this position for 3 seconds. 4. Slowly lower your leg to the starting position. 5. Let your leg relax completely after each repetition. Repeat 2 times. Complete this exercise 3 times per week. Document Released: 11/09/2004 Document Revised: 09/20/2015 Document Reviewed: 11/01/2014 Elsevier Interactive Patient Education  2017 [employer].

## 2020-05-28 NOTE — Progress Notes (Signed)
Chief Complaint  Patient presents with  . New Patient (Initial Visit)    Concerns/questions:  1. Back Ache 2. Pt has residual pain from shingles. 3. R leg/ knee pain. 4. Urine and bowel incontinence. 5. Low appetite. Pt has been d/c from hospice.        New Patient Visit SUBJECTIVE: HPI: David Marzec Sr. is an 87 y.o.male who is being seen for establishing care.  He is here with his daughter who helps provide the history.  The patient was previously followed through Hospice and was recently d/c'd.   In the summer 2021, the patient's wife passed away.  He had known her since he was in kindergarten.  He stopped eating and went into renal failure.  His kidney function improved and he was discharged from hospice.  They started him on Decadron which helped with his mood in addition to pain.  He is currently taking 2 mg every other day as it did cause fluid retention.  He has no desire to closely monitor blood work or bone density.  Residual pain after shingles Pt had a bout of shingles affecting his L shoulder around 7 mo ago. He has burning pain since then. He has tried nothing thus far. No longer having a rash.   R knee/thigh pain Has all over pain, worse in this area. Has been going on for 60 years. Decadron has helped. Hx of OA. No inj or change in activity.  He was told he needs a knee replacement around 30 years ago.  He does not want this.  He has never had an injection.  Urinary incontinence The patient has a history of urinary incontinence.  This is improving but 25% of the time he is unable to make it to the restroom.  He is adjusted life wearing adult diapers and padding to avoid soiling his clothing.  He is not having any bleeding, discharge, or pain with urination.  Past Medical History:  Diagnosis Date  . Hypertension    Past Surgical History:  Procedure Laterality Date  . APPENDECTOMY    . HEMORROIDECTOMY    . TONSILLECTOMY     Family History  Problem Relation Age of  Onset  . Heart attack Mother 69       Lived to age 29  . Heart attack Father    No Known Allergies  Current Outpatient Medications:  .  furosemide (LASIX) 40 MG tablet, Take 1 tablet (40 mg total) by mouth 2 (two) times a week. Tues and Friday (Patient taking differently: Take 40 mg by mouth 2 (two) times a week. Depending on weight.), Disp: 10 tablet, Rfl: 3 .  LORazepam (ATIVAN) 0.5 MG tablet, Take 0.5 mg by mouth every 4 (four) hours as needed for anxiety., Disp: , Rfl:  .  metoprolol succinate (TOPROL-XL) 25 MG 24 hr tablet, Take 12.5 mg by mouth daily., Disp: , Rfl:  .  mirtazapine (REMERON) 7.5 MG tablet, Take 1 tablet (7.5 mg total) by mouth at bedtime., Disp: 90 tablet, Rfl: 1 .  nystatin (MYCOSTATIN/NYSTOP) powder, Apply 1 application topically 2 (two) times daily., Disp: , Rfl:  .  omeprazole (PRILOSEC) 20 MG capsule, Take 20 mg by mouth daily., Disp: , Rfl:  .  oxybutynin (DITROPAN XL) 5 MG 24 hr tablet, Take 1 tablet (5 mg total) by mouth at bedtime., Disp: 30 tablet, Rfl: 1 .  senna-docusate (SENOKOT-S) 8.6-50 MG tablet, Take 1-2 tablets by mouth daily as needed for constipation. As needed., Disp: , Rfl:  .  venlafaxine XR (EFFEXOR XR) 37.5 MG 24 hr capsule, Take 1 capsule (37.5 mg total) by mouth daily with breakfast., Disp: 30 capsule, Rfl: 2 .  amLODipine (NORVASC) 5 MG tablet, Take 1 tablet (5 mg total) by mouth daily., Disp: 90 tablet, Rfl: 2 .  dexamethasone (DECADRON) 2 MG tablet, Take 0.5 tablets (1 mg total) by mouth every other day., Disp: 90 tablet, Rfl: 2  OBJECTIVE: BP (!) 142/78 (BP Location: Left Arm, Patient Position: Sitting, Cuff Size: Normal)   Pulse (!) 52   Temp 98 F (36.7 C) (Oral)   Resp 18   Wt 181 lb 12.8 oz (82.5 kg)   SpO2 96%   BMI 27.64 kg/m  General:  well developed, well nourished, in no apparent distress Skin:  no significant moles, warts, or growths Nose:  nares patent, septum midline, mucosa normal Lungs:  clear to auscultation, breath  sounds equal bilaterally, no respiratory distress Cardio:  regular rate and rhythm, 2+ pitting bilateral LE edema tapering at the proximal third of the tibia Neuro:  gait antalgic Psych: well oriented with normal range of affect and appropriate judgment/insight  ASSESSMENT/PLAN: Post herpetic neuralgia - Plan: venlafaxine XR (EFFEXOR XR) 37.5 MG 24 hr capsule  Primary osteoarthritis of right knee - Plan: dexamethasone (DECADRON) 2 MG tablet  Urinary incontinence, unspecified type - Plan: oxybutynin (DITROPAN XL) 5 MG 24 hr tablet  Hyperlipidemia associated with type 2 diabetes mellitus ([place]), Chronic  1.  Chronic, unstable.  Start venlafaxine extended release 37.5 mg daily.  This might help with other aspects of his health as well. 2.  Chronic, stable.  Pain is stable right now.  He will continue the Decadron 2 mg every other day.  I did give him some stretches and exercises.  If his pain increases, I would recommend a steroid injection.  He is strongly against a knee replacement.  We did have an informed decision making process regarding chronic steroid use.  He did try to come off of it and his quality of life plummeted.  He would like to stay on it.  He does not wish to monitor sugars or bone density.  I think this is reasonable given his age. 3.  Start Ditropan XL 5 mg daily.  Continue behavioral modification. Patient should return in 1 month recheck 1/3. The patient and his daughter voiced understanding and agreement to the plan.   [place], DO 05/28/20  11:59 AM

## 2020-06-03 ENCOUNTER — Ambulatory Visit (HOSPITAL_COMMUNITY)
Admission: RE | Admit: 2020-06-03 | Discharge: 2020-06-03 | Disposition: A | Discharge status: Home / Self Care | Payer: Medicare HMO | Source: Ambulatory Visit | Attending: Cardiology | Admitting: Cardiology

## 2020-06-03 ENCOUNTER — Telehealth: Payer: Self-pay

## 2020-06-03 ENCOUNTER — Other Ambulatory Visit: Payer: Self-pay

## 2020-06-03 DIAGNOSIS — I340 Nonrheumatic mitral (valve) insufficiency: Secondary | ICD-10-CM | POA: Diagnosis not present

## 2020-06-03 DIAGNOSIS — I358 Other nonrheumatic aortic valve disorders: Secondary | ICD-10-CM | POA: Insufficient documentation

## 2020-06-03 DIAGNOSIS — I480 Paroxysmal atrial fibrillation: Secondary | ICD-10-CM | POA: Insufficient documentation

## 2020-06-03 DIAGNOSIS — I10 Essential (primary) hypertension: Secondary | ICD-10-CM | POA: Diagnosis not present

## 2020-06-03 LAB — ECHOCARDIOGRAM COMPLETE: S' Lateral: 3.4 cm

## 2020-06-03 NOTE — Progress Notes (Signed)
  Echocardiogram 2D Echocardiogram has been performed.  David Nichols 06/03/2020, 4:00 PM

## 2020-06-03 NOTE — Telephone Encounter (Signed)
Spoke with patient regarding results and recommendation.  Patient verbalizes understanding and is agreeable to plan of care. Advised patient to call back with any issues or concerns.  

## 2020-06-03 NOTE — Telephone Encounter (Signed)
-----   Message from Richardo Priest, MD sent at 06/03/2020  4:48 PM EDT ----- His ejection fraction, ventricular function is mildly reduced I would make no changes in his current medications.

## 2020-06-28 ENCOUNTER — Ambulatory Visit (INDEPENDENT_AMBULATORY_CARE_PROVIDER_SITE_OTHER): Payer: Medicare HMO | Admitting: Family Medicine

## 2020-06-28 ENCOUNTER — Other Ambulatory Visit: Payer: Self-pay

## 2020-06-28 ENCOUNTER — Encounter: Payer: Self-pay | Admitting: Family Medicine

## 2020-06-28 VITALS — BP 118/72 | HR 55 | Temp 97.7°F | Ht 68.0 in | Wt 172.4 lb

## 2020-06-28 DIAGNOSIS — B0229 Other postherpetic nervous system involvement: Secondary | ICD-10-CM | POA: Diagnosis not present

## 2020-06-28 DIAGNOSIS — R32 Unspecified urinary incontinence: Secondary | ICD-10-CM

## 2020-06-28 MED ORDER — VENLAFAXINE HCL ER 37.5 MG PO CP24: 37.5000 mg | ORAL_CAPSULE | Freq: Every day | ORAL | 2 refills | Status: DC

## 2020-06-28 MED ORDER — OXYBUTYNIN CHLORIDE ER 5 MG PO TB24: 5.0000 mg | ORAL_TABLET | Freq: Every day | ORAL | 2 refills | Status: DC

## 2020-06-28 NOTE — Progress Notes (Signed)
Chief Complaint  Patient presents with   Follow-up    Subjective: Patient is a 87 y.o. male here for f/u.  PHN Started on Effexor XR 37.5 mg/d. Since that time, he reports doing better. Reports compliance w no AE's.   Urinary urgency Started on Ditropan XL 5 mg/d. Reports compliance w no AE's. Doing much better. Still having some stool incontinence that has gotten better. He has not remembered to take Metamucil.   Past Medical History:  Diagnosis Date   Hypertension     Objective: BP 118/72   Pulse (!) 55   Temp 97.7 F (36.5 C) (Oral)   Ht '5\' 8"'$  (1.727 m)   Wt 172 lb 6 oz (78.2 kg)   SpO2 97%   BMI 26.21 kg/m  General: Awake, appears stated age HEENT: MMM, EOMi Heart: Reg rhythm, bradycardic, no murmurs Lungs: CTAB, no rales, wheezes or rhonchi. No accessory muscle use Psych: Age appropriate judgment and insight, normal affect and mood  Assessment and Plan: Post herpetic neuralgia - Plan: venlafaxine XR (EFFEXOR XR) 37.5 MG 24 hr capsule  Urinary incontinence, unspecified type - Plan: oxybutynin (DITROPAN XL) 5 MG 24 hr tablet  Chronic, stable, Cont Effexor XR 37.5 mg/d. Chronic, stable. Cont Ditropan XL 5 mg/d.  F/u in 6 mo for CPE or prn.  The patient voiced understanding and agreement to the plan.  Irene, DO 06/28/20  11:31 AM

## 2020-06-28 NOTE — Patient Instructions (Signed)
Let us know if you need anything.  

## 2020-07-14 ENCOUNTER — Ambulatory Visit: Payer: Medicare HMO | Admitting: Cardiology

## 2020-07-14 ENCOUNTER — Other Ambulatory Visit: Payer: Self-pay

## 2020-07-14 ENCOUNTER — Encounter: Payer: Self-pay | Admitting: Cardiology

## 2020-07-14 VITALS — BP 138/82 | HR 52

## 2020-07-14 DIAGNOSIS — I480 Paroxysmal atrial fibrillation: Secondary | ICD-10-CM | POA: Insufficient documentation

## 2020-07-14 DIAGNOSIS — I129 Hypertensive chronic kidney disease with stage 1 through stage 4 chronic kidney disease, or unspecified chronic kidney disease: Secondary | ICD-10-CM | POA: Insufficient documentation

## 2020-07-14 DIAGNOSIS — N184 Chronic kidney disease, stage 4 (severe): Secondary | ICD-10-CM

## 2020-07-14 DIAGNOSIS — R0989 Other specified symptoms and signs involving the circulatory and respiratory systems: Secondary | ICD-10-CM | POA: Diagnosis not present

## 2020-07-14 NOTE — Progress Notes (Signed)
Cardiology Office Note:    Date:  07/14/2020   ID:  David Maya Sr., DOB 1933-01-03, MRN [id number]  PCP:  Shelda Pal, DO  Cardiologist:  None  Electrophysiologist:  None   Referring MD: Virginia Rochester, PA   I am doing fine   History of Present Illness:    David Quickle Sr. is a 87 y.o. male with a hx of paroxysmal atrial fibrillation with no A. fib seen on his recent monitor, chronic kidney disease, hypertension, mildly depressed ejection fraction recent echo showing EF of 45 to 50%.  The patient follows with Dr. Bettina Gavia was last seen by him on May 23, 2020.  Based on the records it was shown that the patient was on hospice and had had high-dose steroids which led him to develop fluid overload and he was taken diuretics.  At which time he was on calcium channel blockers it was stopped along with his ACE inhibitor's.  When he saw Dr. Bettina Gavia he was hypertensive his low-dose calcium channel blocker was restarted he had an echocardiogram ordered as well as a ZIO monitor to understand A. fib burden.  His echo did show mild ejection fraction as well as reported on his ZIO monitor and my review does not show any evidence of atrial fibrillation.  He is here today with his daughter.  Past Medical History:  Diagnosis Date   Hypertension     Past Surgical History:  Procedure Laterality Date   APPENDECTOMY     HEMORROIDECTOMY     TONSILLECTOMY      Current Medications: Current Meds  Medication Sig   amLODipine (NORVASC) 5 MG tablet Take 1 tablet (5 mg total) by mouth daily.   Calcium Citrate (CITRACAL PO) Take 2 capsules by mouth 2 (two) times daily.   dexamethasone (DECADRON) 2 MG tablet Take 0.5 tablets (1 mg total) by mouth every other day.   LORazepam (ATIVAN) 0.5 MG tablet Take 0.5 mg by mouth every 4 (four) hours as needed for anxiety.   metoprolol succinate (TOPROL-XL) 25 MG 24 hr tablet Take 12.5 mg by mouth daily.   mirtazapine (REMERON) 7.5 MG tablet Take 1 tablet  (7.5 mg total) by mouth at bedtime.   nystatin (MYCOSTATIN/NYSTOP) powder Apply 1 application topically 2 (two) times daily as needed (skin irritation).   oxybutynin (DITROPAN XL) 5 MG 24 hr tablet Take 1 tablet (5 mg total) by mouth at bedtime.   senna-docusate (SENOKOT-S) 8.6-50 MG tablet Take 1-2 tablets by mouth daily as needed for constipation. As needed.   venlafaxine XR (EFFEXOR XR) 37.5 MG 24 hr capsule Take 1 capsule (37.5 mg total) by mouth daily with breakfast.     Allergies:   Patient has no known allergies.   Social History   Socioeconomic History   Marital status: Widowed    Spouse name: Not on file   Number of children: Not on file   Years of education: Not on file   Highest education level: Not on file  Occupational History   Not on file  Tobacco Use   Smoking status: Never   Smokeless tobacco: Never  Substance and Sexual Activity   Alcohol use: Not Currently   Drug use: Never   Sexual activity: Not on file  Other Topics Concern   Not on file  Social History Narrative   Not on file   Social Determinants of Health   Financial Resource Strain: Not on file  Food Insecurity: Not on file  Transportation Needs: Not on  file  Physical Activity: Not on file  Stress: Not on file  Social Connections: Not on file     Family History: The patient's family history includes Heart attack in his father; Heart attack (age of onset: 44) in his mother.  ROS:   Review of Systems  Constitution: Negative for decreased appetite, fever and weight gain.  HENT: Negative for congestion, ear discharge, hoarse voice and sore throat.   Eyes: Negative for discharge, redness, vision loss in right eye and visual halos.  Cardiovascular: Negative for chest pain, dyspnea on exertion, leg swelling, orthopnea and palpitations.  Respiratory: Negative for cough, hemoptysis, shortness of breath and snoring.   Endocrine: Negative for heat intolerance and polyphagia.  Hematologic/Lymphatic:  Negative for bleeding problem. Does not bruise/bleed easily.  Skin: Negative for flushing, nail changes, rash and suspicious lesions.  Musculoskeletal: Negative for arthritis, joint pain, muscle cramps, myalgias, neck pain and stiffness.  Gastrointestinal: Negative for abdominal pain, bowel incontinence, diarrhea and excessive appetite.  Genitourinary: Negative for decreased libido, genital sores and incomplete emptying.  Neurological: Negative for brief paralysis, focal weakness, headaches and loss of balance.  Psychiatric/Behavioral: Negative for altered mental status, depression and suicidal ideas.  Allergic/Immunologic: Negative for HIV exposure and persistent infections.    EKGs/Labs/Other Studies Reviewed:    The following studies were reviewed today:   EKG: None today  Transthoracic echocardiogram Jun 03, 2020 IMPRESSIONS   1. EF difficult to quantitate due to ecopy but appears low normal to  mildly reduced on apical images.   2. Left ventricular ejection fraction, by estimation, is 45 to 50%. The  left ventricle has mildly decreased function. The left ventricle  demonstrates global hypokinesis. Left ventricular diastolic parameters are  indeterminate.   3. Right ventricular systolic function is normal. The right ventricular  size is normal.   4. The mitral valve is normal in structure. Mild mitral valve  regurgitation. No evidence of mitral stenosis.   5. The aortic valve is tricuspid. Aortic valve regurgitation is not  visualized. Mild aortic valve sclerosis is present, with no evidence of  aortic valve stenosis.   6. Aortic dilatation noted. There is mild dilatation of the aortic root,  measuring 39 mm.   7. The inferior vena cava is normal in size with greater than 50%  respiratory variability, suggesting right atrial pressure of 3 mmHg.   FINDINGS   Left Ventricle: Left ventricular ejection fraction, by estimation, is 45  to 50%. The left ventricle has mildly  decreased function. The left  ventricle demonstrates global hypokinesis. The left ventricular internal  cavity size was normal in size. There is   no left ventricular hypertrophy. Left ventricular diastolic parameters  are indeterminate.   Right Ventricle: The right ventricular size is normal.Right ventricular  systolic function is normal.   Left Atrium: Left atrial size was normal in size.   Right Atrium: Right atrial size was normal in size.   Pericardium: Trivial pericardial effusion is present.   Mitral Valve: The mitral valve is normal in structure. Mild mitral valve  regurgitation. No evidence of mitral valve stenosis.   Tricuspid Valve: The tricuspid valve is normal in structure. Tricuspid  valve regurgitation is trivial. No evidence of tricuspid stenosis.   Aortic Valve: The aortic valve is tricuspid. Aortic valve regurgitation is  not visualized. Mild aortic valve sclerosis is present, with no evidence  of aortic valve stenosis.   Pulmonic Valve: The pulmonic valve was normal in structure. Pulmonic valve  regurgitation is trivial.  No evidence of pulmonic stenosis.   Aorta: Aortic dilatation noted. There is mild dilatation of the aortic  root, measuring 39 mm.   Venous: The inferior vena cava is normal in size with greater than 50%  respiratory variability, suggesting right atrial pressure of 3 mmHg.   IAS/Shunts: No atrial level shunt detected by color flow Doppler.   Additional Comments: EF difficult to quantitate due to ecopy but appears  low normal to mildly reduced on apical images.   ZIO monitor Patch Wear Time:  7 days and 20 hours (2022-04-15T15:55:37-0400 to 2022-04-23T12:41:05-398)   Patient had a min HR of 47 bpm, max HR of 266 bpm, and avg HR of 77 bpm. Predominant underlying rhythm was Sinus Rhythm. Slight P wave morphology changes were noted. 93 Ventricular Tachycardia runs occurred, the run with the fastest interval lasting 5 beats with a max rate of  266 bpm, the longest lasting 6 beats with an avg rate of 137 bpm. 15250 Supraventricular Tachycardia runs occurred, the run with the fastest interval lasting 4 beats with a max rate of 207 bpm, the longest lasting 10.7 secs with an avg rate of 126 bpm. Some episodes of Supraventricular Tachycardia may be possible Atrial Tachycardia with variable block. Isolated SVEs were frequent (8.7%, [id number]), SVE Couplets were frequent (6.2%, [id number]), and SVE Triplets were occasional (4.8%, [id number]). Isolated VEs were frequent (7.7%, [id number]), VE Couplets were occasional (1.3%, 5975), and VE Triplets were rare (<1.0%, 1096). Ventricular Bigeminy and Trigeminy were present.   There are no triggered or diary events.   Ventricular ectopy was frequent PVCs occasional couplets rare triplets and frequent runs of PVCs the longest 6 complexes rate of 137 bpm.   Supraventricular ectopy was frequent with repeated episodes of brief runs of atrial premature contractions but no episodes of atrial fibrillation or flutter.     Conclusion, frequent ventricular ectopy with couplets triplets and repeated runs of PVCs the longest 6 complexes, frequent supraventricular ectopy with repeated episodes of brief atrial tachycardia but no episodes of atrial fibrillation or flutter.  Recent Labs: 02/03/2020: ALT 22; BUN 80; Creatinine, Ser 2.84; Hemoglobin 15.4; Platelets 396; Potassium 5.5; Sodium 138  Recent Lipid Panel No results found for: CHOL, TRIG, HDL, CHOLHDL, VLDL, LDLCALC, LDLDIRECT  Physical Exam:    VS:  There were no vitals taken for this visit.    Wt Readings from Last 3 Encounters:  06/28/20 172 lb 6 oz (78.2 kg)  05/28/20 181 lb 12.8 oz (82.5 kg)  04/23/20 185 lb (83.9 kg)     GEN: Well nourished, well developed in no acute distress HEENT: Normal NECK: No JVD; No carotid bruits LYMPHATICS: No lymphadenopathy CARDIAC: S1S2 noted,RRR, no murmurs, rubs, gallops RESPIRATORY:  Clear to auscultation without rales,  wheezing or rhonchi  ABDOMEN: Soft, non-tender, non-distended, +bowel sounds, no guarding. EXTREMITIES: No edema, No cyanosis, no clubbing MUSCULOSKELETAL:  No deformity  SKIN: Warm and dry NEUROLOGIC:  Alert and oriented x 3, non-focal PSYCHIATRIC:  Normal affect, good insight  ASSESSMENT:    1. Benign hypertension with CKD (chronic kidney disease) stage IV ([place])   2. Depressed left ventricular ejection fraction   3. Paroxysmal atrial fibrillation (HCC)    PLAN:     He appears to be euvolemic.  We discussed his echo report again as well as his monitor there are no questions brought from the patient and his daughter.  No medication changes will be made today. Question of atrial fibrillation still exist but for right now I will  keep him on his low-dose beta-blocker. The patient is in agreement with the above plan. The patient left the office in stable condition.  The patient will follow up in   Medication Adjustments/Labs and Tests Ordered: Current medicines are reviewed at length with the patient today.  Concerns regarding medicines are outlined above.  No orders of the defined types were placed in this encounter.  No orders of the defined types were placed in this encounter.   Patient Instructions  Medication Instructions:  Your physician recommends that you continue on your current medications as directed. Please refer to the Current Medication list given to you today.  *If you need a refill on your cardiac medications before your next appointment, please call your pharmacy*   Lab Work: None If you have labs (blood work) drawn today and your tests are completely normal, you will receive your results only by: [place] (if you have MyChart) OR A paper copy in the mail If you have any lab test that is abnormal or we need to change your treatment, we will call you to review the results.   Testing/Procedures: None   Follow-Up: At [facility], you and your  health needs are our priority.  As part of our continuing mission to provide you with exceptional heart care, we have created designated Provider Care Teams.  These Care Teams include your primary Cardiologist (physician) and Advanced Practice Providers (APPs -  Physician Assistants and Nurse Practitioners) who all work together to provide you with the care you need, when you need it.  We recommend signing up for the patient portal called "MyChart".  Sign up information is provided on this After Visit Summary.  MyChart is used to connect with patients for Virtual Visits (Telemedicine).  Patients are able to view lab/test results, encounter notes, upcoming appointments, etc.  Non-urgent messages can be sent to your provider as well.   To learn more about what you can do with MyChart, go to [email or web address].    Your next appointment:   6 month(s)  The format for your next appointment:   In Person  Provider:   Shirlee More, MD   Other Instructions    Adopting a Healthy Lifestyle.  Know what a healthy weight is for you (roughly BMI <25) and aim to maintain this   Aim for 7+ servings of fruits and vegetables daily   65-80+ fluid ounces of water or unsweet tea for healthy kidneys   Limit to max 1 drink of alcohol per day; avoid smoking/tobacco   Limit animal fats in diet for cholesterol and heart health - choose grass fed whenever available   Avoid highly processed foods, and foods high in saturated/trans fats   Aim for low stress - take time to unwind and care for your mental health   Aim for 150 min of moderate intensity exercise weekly for heart health, and weights twice weekly for bone health   Aim for 7-9 hours of sleep daily   When it comes to diets, agreement about the perfect plan isnt easy to find, even among the experts. Experts at the [employer] developed an idea known as the Healthy Eating Plate. Just imagine a plate divided into logical,  healthy portions.   The emphasis is on diet quality:   Load up on vegetables and fruits - one-half of your plate: Aim for color and variety, and remember that potatoes dont count.   Go for whole grains - one-quarter of your plate:  Whole wheat, barley, wheat berries, quinoa, oats, brown rice, and foods made with them. If you want pasta, go with whole wheat pasta.   Protein power - one-quarter of your plate: Fish, chicken, beans, and nuts are all healthy, versatile protein sources. Limit red meat.   The diet, however, does go beyond the plate, offering a few other suggestions.   Use healthy plant oils, such as olive, canola, soy, corn, sunflower and peanut. Check the labels, and avoid partially hydrogenated oil, which have unhealthy trans fats.   If youre thirsty, drink water. Coffee and tea are good in moderation, but skip sugary drinks and limit milk and dairy products to one or two daily servings.   The type of carbohydrate in the diet is more important than the amount. Some sources of carbohydrates, such as vegetables, fruits, whole grains, and beans-are healthier than others.   Finally, stay active  Signed, Berniece Salines, DO  07/14/2020 3:17 PM    [facility]

## 2020-07-14 NOTE — Patient Instructions (Signed)

## 2020-09-06 ENCOUNTER — Other Ambulatory Visit: Payer: Self-pay | Admitting: Family Medicine

## 2020-09-06 MED ORDER — NYSTATIN 100000 UNIT/GM EX POWD: 1.0000 "application " | Freq: Two times a day (BID) | CUTANEOUS | 1 refills | Status: AC | PRN

## 2020-09-16 ENCOUNTER — Telehealth: Payer: Self-pay | Admitting: *Deleted

## 2020-09-16 NOTE — Telephone Encounter (Signed)
Got message from cover my meds that "Eligibility could not be verified for this patient - patient not found. Please review patient information and re-submit."  Called pharmacy to verify correct information and then called insurance.  Insurance stated that they had a denial letter that was dated on 09/12/20.

## 2020-09-16 NOTE — Telephone Encounter (Signed)
Prior auth started via cover my meds.  Awaiting determination.   Key: [id number]

## 2020-11-22 ENCOUNTER — Telehealth: Payer: Self-pay

## 2020-11-22 ENCOUNTER — Ambulatory Visit (INDEPENDENT_AMBULATORY_CARE_PROVIDER_SITE_OTHER): Payer: Medicare HMO

## 2020-11-22 VITALS — Ht 68.0 in | Wt 172.0 lb

## 2020-11-22 DIAGNOSIS — Z0000 Encounter for general adult medical examination without abnormal findings: Secondary | ICD-10-CM | POA: Diagnosis not present

## 2020-11-22 NOTE — Patient Instructions (Signed)
David Nichols , Thank you for taking time to complete your Medicare Wellness Visit. I appreciate your ongoing commitment to your health goals. Please review the following plan we discussed and let me know if I can assist you in the future.   Screening recommendations/referrals: Colonoscopy: No longer required Recommended yearly ophthalmology/optometry visit for glaucoma screening and checkup Recommended yearly dental visit for hygiene and checkup  Vaccinations: Influenza vaccine: Declined Pneumococcal vaccine: Declined Tdap vaccine: Declined Shingles vaccine: Discuss with pharmacy   Covid-19: Declined  Advanced directives: Please bring a copy of Living Will and/or [place] for your chart.   Conditions/risks identified: See problem list  Next appointment: Follow up in one year for your annual wellness visit.   Preventive Care 85 Years and Older, Male Preventive care refers to lifestyle choices and visits with your health care provider that can promote health and wellness. What does preventive care include? A yearly physical exam. This is also called an annual well check. Dental exams once or twice a year. Routine eye exams. Ask your health care provider how often you should have your eyes checked. Personal lifestyle choices, including: Daily care of your teeth and gums. Regular physical activity. Eating a healthy diet. Avoiding tobacco and drug use. Limiting alcohol use. Practicing safe sex. Taking low doses of aspirin every day. Taking vitamin and mineral supplements as recommended by your health care provider. What happens during an annual well check? The services and screenings done by your health care provider during your annual well check will depend on your age, overall health, lifestyle risk factors, and family history of disease. Counseling  Your health care provider may ask you questions about your: Alcohol use. Tobacco use. Drug use. Emotional  well-being. Home and relationship well-being. Sexual activity. Eating habits. History of falls. Memory and ability to understand (cognition). Work and work Statistician. Screening  You may have the following tests or measurements: Height, weight, and BMI. Blood pressure. Lipid and cholesterol levels. These may be checked every 5 years, or more frequently if you are over 60 years old. Skin check. Lung cancer screening. You may have this screening every year starting at age 85 if you have a 30-pack-year history of smoking and currently smoke or have quit within the past 15 years. Fecal occult blood test (FOBT) of the stool. You may have this test every year starting at age 85. Flexible sigmoidoscopy or colonoscopy. You may have a sigmoidoscopy every 5 years or a colonoscopy every 10 years starting at age 85. Prostate cancer screening. Recommendations will vary depending on your family history and other risks. Hepatitis C blood test. Hepatitis B blood test. Sexually transmitted disease (STD) testing. Diabetes screening. This is done by checking your blood sugar (glucose) after you have not eaten for a while (fasting). You may have this done every 1-3 years. Abdominal aortic aneurysm (AAA) screening. You may need this if you are a current or former smoker. Osteoporosis. You may be screened starting at age 85 if you are at high risk. Talk with your health care provider about your test results, treatment options, and if necessary, the need for more tests. Vaccines  Your health care provider may recommend certain vaccines, such as: Influenza vaccine. This is recommended every year. Tetanus, diphtheria, and acellular pertussis (Tdap, Td) vaccine. You may need a Td booster every 10 years. Zoster vaccine. You may need this after age 85. Pneumococcal 13-valent conjugate (PCV13) vaccine. One dose is recommended after age 85. Pneumococcal polysaccharide (PPSV23)  vaccine. One dose is recommended after  age 85. Talk to your health care provider about which screenings and vaccines you need and how often you need them. This information is not intended to replace advice given to you by your health care provider. Make sure you discuss any questions you have with your health care provider. Document Released: 01/22/2015 Document Revised: 09/15/2015 Document Reviewed: 10/27/2014 Elsevier Interactive Patient Education  2017 [employer] Prevention in the Home Falls can cause injuries. They can happen to people of all ages. There are many things you can do to make your home safe and to help prevent falls. What can I do on the outside of my home? Regularly fix the edges of walkways and driveways and fix any cracks. Remove anything that might make you trip as you walk through a door, such as a raised step or threshold. Trim any bushes or trees on the path to your home. Use bright outdoor lighting. Clear any walking paths of anything that might make someone trip, such as rocks or tools. Regularly check to see if handrails are loose or broken. Make sure that both sides of any steps have handrails. Any raised decks and porches should have guardrails on the edges. Have any leaves, snow, or ice cleared regularly. Use sand or salt on walking paths during winter. Clean up any spills in your garage right away. This includes oil or grease spills. What can I do in the bathroom? Use night lights. Install grab bars by the toilet and in the tub and shower. Do not use towel bars as grab bars. Use non-skid mats or decals in the tub or shower. If you need to sit down in the shower, use a plastic, non-slip stool. Keep the floor dry. Clean up any water that spills on the floor as soon as it happens. Remove soap buildup in the tub or shower regularly. Attach bath mats securely with double-sided non-slip rug tape. Do not have throw rugs and other things on the floor that can make you trip. What can I do in the  bedroom? Use night lights. Make sure that you have a light by your bed that is easy to reach. Do not use any sheets or blankets that are too big for your bed. They should not hang down onto the floor. Have a firm chair that has side arms. You can use this for support while you get dressed. Do not have throw rugs and other things on the floor that can make you trip. What can I do in the kitchen? Clean up any spills right away. Avoid walking on wet floors. Keep items that you use a lot in easy-to-reach places. If you need to reach something above you, use a strong step stool that has a grab bar. Keep electrical cords out of the way. Do not use floor polish or wax that makes floors slippery. If you must use wax, use non-skid floor wax. Do not have throw rugs and other things on the floor that can make you trip. What can I do with my stairs? Do not leave any items on the stairs. Make sure that there are handrails on both sides of the stairs and use them. Fix handrails that are broken or loose. Make sure that handrails are as long as the stairways. Check any carpeting to make sure that it is firmly attached to the stairs. Fix any carpet that is loose or worn. Avoid having throw rugs at the top or bottom  of the stairs. If you do have throw rugs, attach them to the floor with carpet tape. Make sure that you have a light switch at the top of the stairs and the bottom of the stairs. If you do not have them, ask someone to add them for you. What else can I do to help prevent falls? Wear shoes that: Do not have high heels. Have rubber bottoms. Are comfortable and fit you well. Are closed at the toe. Do not wear sandals. If you use a stepladder: Make sure that it is fully opened. Do not climb a closed stepladder. Make sure that both sides of the stepladder are locked into place. Ask someone to hold it for you, if possible. Clearly mark and make sure that you can see: Any grab bars or  handrails. First and last steps. Where the edge of each step is. Use tools that help you move around (mobility aids) if they are needed. These include: Canes. Walkers. Scooters. Crutches. Turn on the lights when you go into a dark area. Replace any light bulbs as soon as they burn out. Set up your furniture so you have a clear path. Avoid moving your furniture around. If any of your floors are uneven, fix them. If there are any pets around you, be aware of where they are. Review your medicines with your doctor. Some medicines can make you feel dizzy. This can increase your chance of falling. Ask your doctor what other things that you can do to help prevent falls. This information is not intended to replace advice given to you by your health care provider. Make sure you discuss any questions you have with your health care provider. Document Released: 10/22/2008 Document Revised: 06/03/2015 Document Reviewed: 01/30/2014 Elsevier Interactive Patient Education  2017 [employer].

## 2020-11-22 NOTE — Telephone Encounter (Signed)
Patient's daughter is requesting a handicap parking placard for patient.

## 2020-11-22 NOTE — Progress Notes (Signed)
Subjective:   David Simonetti Sr. is a 87 y.o. male who presents for an Initial Medicare Annual Wellness Visit.  I connected with David Nichols today by telephone and verified that I am speaking with the correct person using two identifiers. Location patient: home Location provider: work Persons participating in the virtual visit: patient, daughter, Marine scientist.    I discussed the limitations, risks, security and privacy concerns of performing an evaluation and management service by telephone and the availability of in person appointments. I also discussed with the patient that there may be a patient responsible charge related to this service. The patient expressed understanding and verbally consented to this telephonic visit.    Interactive audio and video telecommunications were attempted between this provider and patient, however failed, due to patient having technical difficulties OR patient did not have access to video capability.  We continued and completed visit with audio only.  Some vital signs may be absent or patient reported.   Time Spent with patient on telephone encounter: 25 minutes   Review of Systems     Cardiac Risk Factors include: advanced age (>42men, >32 women);male gender;diabetes mellitus;dyslipidemia;hypertension     Objective:    Today's Vitals   11/22/20 0944  Weight: 172 lb (78 kg)  Height: 5\' 8"  (1.727 m)   Body mass index is 26.15 kg/m.  Advanced Directives 11/22/2020 01/03/2020 07/20/2019 06/15/2018  Does Patient Have a Medical Advance Directive? Yes Yes No No  Type of Paramedic of [employer];Living will Living will - -  Copy of [place] in Chart? No - copy requested - - -    Current Medications (verified) Outpatient Encounter Medications as of 11/22/2020  Medication Sig   amLODipine (NORVASC) 5 MG tablet Take 1 tablet (5 mg total) by mouth daily.   Calcium Citrate (CITRACAL PO) Take 2 capsules by mouth 2 (two) times  daily.   dexamethasone (DECADRON) 2 MG tablet Take 0.5 tablets (1 mg total) by mouth every other day.   LORazepam (ATIVAN) 0.5 MG tablet Take 0.5 mg by mouth every 4 (four) hours as needed for anxiety.   metoprolol succinate (TOPROL-XL) 25 MG 24 hr tablet Take 12.5 mg by mouth daily.   mirtazapine (REMERON) 7.5 MG tablet Take 1 tablet (7.5 mg total) by mouth at bedtime.   nystatin (MYCOSTATIN/NYSTOP) powder Apply 1 application topically 2 (two) times daily as needed (skin irritation).   oxybutynin (DITROPAN XL) 5 MG 24 hr tablet Take 1 tablet (5 mg total) by mouth at bedtime.   senna-docusate (SENOKOT-S) 8.6-50 MG tablet Take 1-2 tablets by mouth daily as needed for constipation. As needed.   venlafaxine XR (EFFEXOR XR) 37.5 MG 24 hr capsule Take 1 capsule (37.5 mg total) by mouth daily with breakfast.   furosemide (LASIX) 40 MG tablet Take 1 tablet (40 mg total) by mouth 2 (two) times a week. Tues and Friday (Patient not taking: No sig reported)   omeprazole (PRILOSEC) 20 MG capsule Take 20 mg by mouth daily. (Patient not taking: No sig reported)   [DISCONTINUED] lisinopril (ZESTRIL) 10 MG tablet Take 10 mg by mouth daily.   No facility-administered encounter medications on file as of 11/22/2020.    Allergies (verified) Patient has no known allergies.   History: Past Medical History:  Diagnosis Date   Hypertension    Past Surgical History:  Procedure Laterality Date   APPENDECTOMY     HEMORROIDECTOMY     TONSILLECTOMY     Family History  Problem Relation Age of Onset   Heart attack Mother 84       Lived to age 59   Heart attack Father    Social History   Socioeconomic History   Marital status: Widowed    Spouse name: Not on file   Number of children: Not on file   Years of education: Not on file   Highest education level: Not on file  Occupational History   Not on file  Tobacco Use   Smoking status: Never   Smokeless tobacco: Never  Substance and Sexual Activity    Alcohol use: Not Currently   Drug use: Never   Sexual activity: Not on file  Other Topics Concern   Not on file  Social History Narrative   Not on file   Social Determinants of Health   Financial Resource Strain: Low Risk    Difficulty of Paying Living Expenses: Not hard at all  Food Insecurity: No Food Insecurity   Worried About Running Out of Food in the Last Year: Never true   [place] in the Last Year: Never true  Transportation Needs: No Transportation Needs   Lack of Transportation (Medical): No   Lack of Transportation (Non-Medical): No  Physical Activity: Inactive   Days of Exercise per Week: 0 days   Minutes of Exercise per Session: 0 min  Stress: No Stress Concern Present   Feeling of Stress : Not at all  Social Connections: Socially Isolated   Frequency of Communication with Friends and Family: More than three times a week   Frequency of Social Gatherings with Friends and Family: More than three times a week   Attends Religious Services: Never   Marine scientist or Organizations: No   Attends Archivist Meetings: Never   Marital Status: Widowed    Tobacco Counseling Counseling given: Not Answered   Clinical Intake:  Pre-visit preparation completed: Yes  Pain : No/denies pain     BMI - recorded: 26.15 Nutritional Status: BMI 25 -29 Overweight Nutritional Risks: None Diabetes: Yes CBG done?: No Did pt. bring in CBG monitor from home?: No (phone visit)  How often do you need to have someone help you when you read instructions, pamphlets, or other written materials from your doctor or pharmacy?: 1 - Never  Diabetes:  Is the patient diabetic?  Yes  If diabetic, was a CBG obtained today?  No  Did the patient bring in their glucometer from home?  No phone visit How often do you monitor your CBG's? never.   Financial Strains and Diabetes Management:  Are you having any financial strains with the device, your supplies or your  medication? No .  Does the patient want to be seen by Chronic Care Management for management of their diabetes?  No  Would the patient like to be referred to a Nutritionist or for Diabetic Management?  No   Diabetic Exams:  Diabetic Eye Exam: . Overdue for diabetic eye exam. Pt has been advised about the importance in completing this exam. Patient advised to make an appt  Diabetic Foot Exam:Pt has been advised about the importance in completing this exam. To be completed by PCP.   Interpreter Needed?: No  Information entered by :: Caroleen Hamman LPN   Activities of Daily Living In your present state of health, do you have any difficulty performing the following activities: 11/22/2020 06/28/2020  Hearing? N N  Vision? N N  Difficulty concentrating or making decisions? N  Y  Walking or climbing stairs? Y Y  Comment stairs -  Dressing or bathing? Y N  Comment bathing -  Doing errands, shopping? Tempie Donning  Preparing Food and eating ? Y -  Using the Toilet? N -  In the past six months, have you accidently leaked urine? Y -  Do you have problems with loss of bowel control? N -  Managing your Medications? Y -  Comment daughter assists -  Managing your Finances? N -  Housekeeping or managing your Housekeeping? N -  Some recent data might be hidden    Patient Care Team: Shelda Pal, DO as PCP - General (Family Medicine)  Indicate any recent Medical Services you may have received from other than [facility] providers in the past year (date may be approximate).     Assessment:   This is a routine wellness examination for David Nichols.  Hearing/Vision screen Hearing Screening - Comments:: No issues Vision Screening - Comments:: Last eye exam-years ago-  Dietary issues and exercise activities discussed: Current Exercise Habits: The patient does not participate in regular exercise at present   Goals Addressed             This Visit's Progress    Patient Stated       Maintain  current health       Depression Screen PHQ 2/9 Scores 11/22/2020 05/28/2020  PHQ - 2 Score 0 0    Fall Risk Fall Risk  11/22/2020 06/28/2020  Falls in the past year? 0 1  Number falls in past yr: 0 0  Injury with Fall? 0 0  Risk for fall due to : - No Fall Risks;History of fall(s)  Follow up Falls prevention discussed Falls evaluation completed    FALL RISK PREVENTION PERTAINING TO THE HOME:  Any stairs in or around the home? No  Home free of loose throw rugs in walkways, pet beds, electrical cords, etc? No  Adequate lighting in your home to reduce risk of falls? Yes   ASSISTIVE DEVICES UTILIZED TO PREVENT FALLS:  Life alert? No  Use of a cane, walker or w/c? Yes  Grab bars in the bathroom? No  Shower chair or bench in shower? No  Elevated toilet seat or a handicapped toilet? No   TIMED UP AND GO:  Was the test performed? No . phone   Cognitive Function:Normal cognitive status assessed by  this Nurse Health Advisor. No abnormalities found.          Immunizations  There is no immunization history on file for this patient.  TDAP status: Due, Education has been provided regarding the importance of this vaccine. Advised may receive this vaccine at local pharmacy or Health Dept. Aware to provide a copy of the vaccination record if obtained from local pharmacy or Health Dept. Verbalized acceptance and understanding.  Flu Vaccine status: Declined, Education has been provided regarding the importance of this vaccine but patient still declined. Advised may receive this vaccine at local pharmacy or Health Dept. Aware to provide a copy of the vaccination record if obtained from local pharmacy or Health Dept. Verbalized acceptance and understanding.  Pneumococcal vaccine status: Declined,  Education has been provided regarding the importance of this vaccine but patient still declined. Advised may receive this vaccine at local pharmacy or Health Dept. Aware to provide a copy of  the vaccination record if obtained from local pharmacy or Health Dept. Verbalized acceptance and understanding.   Covid-19 vaccine status: Declined, Education has been provided  regarding the importance of this vaccine but patient still declined. Advised may receive this vaccine at local pharmacy or Health Dept.or vaccine clinic. Aware to provide a copy of the vaccination record if obtained from local pharmacy or Health Dept. Verbalized acceptance and understanding.  Qualifies for Shingles Vaccine? Yes   Zostavax completed No   Shingrix Completed?: No.    Education has been provided regarding the importance of this vaccine. Patient has been advised to call insurance company to determine out of pocket expense if they have not yet received this vaccine. Advised may also receive vaccine at local pharmacy or Health Dept. Verbalized acceptance and understanding.  Screening Tests Health Maintenance  Topic Date Due   HEMOGLOBIN A1C  Never done   COVID-19 Vaccine (1) Never done   Pneumonia Vaccine 18+ Years old (1 - PCV) Never done   FOOT EXAM  Never done   OPHTHALMOLOGY EXAM  Never done   URINE MICROALBUMIN  Never done   Zoster Vaccines- Shingrix (1 of 2) Never done   TETANUS/TDAP  01/09/2018   INFLUENZA VACCINE  Never done   HPV VACCINES  Aged Out    Health Maintenance  Health Maintenance Due  Topic Date Due   HEMOGLOBIN A1C  Never done   COVID-19 Vaccine (1) Never done   Pneumonia Vaccine 30+ Years old (1 - PCV) Never done   FOOT EXAM  Never done   OPHTHALMOLOGY EXAM  Never done   URINE MICROALBUMIN  Never done   Zoster Vaccines- Shingrix (1 of 2) Never done   TETANUS/TDAP  01/09/2018   INFLUENZA VACCINE  Never done    Colorectal cancer screening: No longer required.   Lung Cancer Screening: (Low Dose CT Chest recommended if Age 24-80 years, 30 pack-year currently smoking OR have quit w/in 15years.) does not qualify.     Additional Screening:  Hepatitis C Screening: does not  qualify  Vision Screening: Recommended annual ophthalmology exams for early detection of glaucoma and other disorders of the eye. Is the patient up to date with their annual eye exam?  No  Who is the provider or what is the name of the office in which the patient attends annual eye exams? none   Dental Screening: Recommended annual dental exams for proper oral hygiene  Community Resource Referral / Chronic Care Management: CRR required this visit?  No   CCM required this visit?  No      Plan:     I have personally reviewed and noted the following in the patient's chart:   Medical and social history Use of alcohol, tobacco or illicit drugs  Current medications and supplements including opioid prescriptions. Patient is not currently taking opioid prescriptions. Functional ability and status Nutritional status Physical activity Advanced directives List of other physicians Hospitalizations, surgeries, and ER visits in previous 12 months Vitals Screenings to include cognitive, depression, and falls Referrals and appointments  In addition, I have reviewed and discussed with patient certain preventive protocols, quality metrics, and best practice recommendations. A written personalized care plan for preventive services as well as general preventive health recommendations were provided to patient.   Due to this being a telephonic visit, the after visit summary with patients personalized plan was offered to patient via mail or my-chart.  Patient would like to access on my-chart.   Marta Antu, LPN   [id number]  Nurse Health Advisor  Nurse Notes: None

## 2020-11-23 NOTE — Telephone Encounter (Signed)
Called the patient and he informed they did not have a form We completed our part and mailed for to his home

## 2020-11-23 NOTE — Telephone Encounter (Signed)
Called the daughter left message to call back.

## 2020-11-23 NOTE — Telephone Encounter (Signed)
Needs to bring in form with their portion filled out and we will complete the rest. Ty.

## 2020-12-21 ENCOUNTER — Other Ambulatory Visit: Payer: Self-pay | Admitting: Family Medicine

## 2020-12-28 ENCOUNTER — Encounter: Payer: Self-pay | Admitting: Family Medicine

## 2020-12-28 ENCOUNTER — Ambulatory Visit (INDEPENDENT_AMBULATORY_CARE_PROVIDER_SITE_OTHER): Payer: Medicare HMO | Admitting: Family Medicine

## 2020-12-28 VITALS — BP 120/80 | HR 78 | Temp 98.2°F | Ht 68.0 in | Wt 193.0 lb

## 2020-12-28 DIAGNOSIS — Z0000 Encounter for general adult medical examination without abnormal findings: Secondary | ICD-10-CM | POA: Diagnosis not present

## 2020-12-28 DIAGNOSIS — Z23 Encounter for immunization: Secondary | ICD-10-CM

## 2020-12-28 DIAGNOSIS — N183 Chronic kidney disease, stage 3 unspecified: Secondary | ICD-10-CM

## 2020-12-28 DIAGNOSIS — E785 Hyperlipidemia, unspecified: Secondary | ICD-10-CM

## 2020-12-28 DIAGNOSIS — E1169 Type 2 diabetes mellitus with other specified complication: Secondary | ICD-10-CM | POA: Diagnosis not present

## 2020-12-28 DIAGNOSIS — R5381 Other malaise: Secondary | ICD-10-CM | POA: Diagnosis not present

## 2020-12-28 MED ORDER — DEXAMETHASONE 1 MG PO TABS: ORAL_TABLET | ORAL | 2 refills | Status: AC

## 2020-12-28 NOTE — Progress Notes (Signed)
Chief Complaint  Patient presents with   Follow-up    Well Male David Zapien Sr. is here for a complete physical.  He is here with his daughter. His last physical was >1 year ago.  Current diet: in general, diet could be better.   Current exercise: walking Weight trend: increased intentionally Fatigue out of ordinary? No. Seat belt? Yes.   Advanced directive? yes  Health maintenance Shingrix- No Tetanus- No Pneumonia vaccine- No  Past Medical History:  Diagnosis Date   Hypertension      Past Surgical History:  Procedure Laterality Date   APPENDECTOMY     HEMORROIDECTOMY     TONSILLECTOMY      Medications  Current Outpatient Medications on File Prior to Visit  Medication Sig Dispense Refill   amLODipine (NORVASC) 5 MG tablet Take 1 tablet (5 mg total) by mouth daily. 90 tablet 2   Calcium Citrate (CITRACAL PO) Take 2 capsules by mouth 2 (two) times daily.     dexamethasone (DECADRON) 2 MG tablet Take 0.5 tablets (1 mg total) by mouth every other day. 90 tablet 2   furosemide (LASIX) 40 MG tablet Take 1 tablet (40 mg total) by mouth 2 (two) times a week. Tues and Friday 10 tablet 3   LORazepam (ATIVAN) 0.5 MG tablet Take 0.5 mg by mouth every 4 (four) hours as needed for anxiety.     metoprolol succinate (TOPROL-XL) 25 MG 24 hr tablet Take 12.5 mg by mouth daily.     mirtazapine (REMERON) 7.5 MG tablet TAKE 1 TABLET BY MOUTH AT BEDTIME 90 tablet 1   nystatin (MYCOSTATIN/NYSTOP) powder Apply 1 application topically 2 (two) times daily as needed (skin irritation). 15 g 1   omeprazole (PRILOSEC) 20 MG capsule Take 20 mg by mouth daily.     oxybutynin (DITROPAN XL) 5 MG 24 hr tablet Take 1 tablet (5 mg total) by mouth at bedtime. 90 tablet 2   senna-docusate (SENOKOT-S) 8.6-50 MG tablet Take 1-2 tablets by mouth daily as needed for constipation. As needed.     venlafaxine XR (EFFEXOR XR) 37.5 MG 24 hr capsule Take 1 capsule (37.5 mg total) by mouth daily with breakfast. 90  capsule 2   [DISCONTINUED] lisinopril (ZESTRIL) 10 MG tablet Take 10 mg by mouth daily.      Allergies No Known Allergies  Family History Family History  Problem Relation Age of Onset   Heart attack Mother 67       Lived to age 1   Heart attack Father     Review of Systems: Constitutional:  no fevers Eye:  no recent significant change in vision Ears:  No changes in hearing Nose/Mouth/Throat:  no complaints of nasal congestion, no sore throat Cardiovascular: no chest pain Respiratory:  No shortness of breath Gastrointestinal:  + Loose stools (improved) GU:  No bleeding Integumentary:  no abnormal skin lesions reported Neurologic:  no headaches Endocrine:  denies unexplained weight changes  Exam BP 120/80    Pulse 78    Temp 98.2 F (36.8 C) (Oral)    Ht 5\' 8"  (1.727 m)    Wt 193 lb (87.5 kg)    SpO2 96%    BMI 29.35 kg/m  General:  well developed, well nourished, in no apparent distress Skin:  no significant moles, warts, or growths Head:  no masses, lesions, or tenderness Eyes:  pupils equal and round, sclera anicteric without injection Ears:  canals without lesions, TMs shiny without retraction, no obvious effusion, no erythema  Nose:  nares patent, septum midline, mucosa normal Throat/Pharynx:  lips and gingiva without lesion; tongue and uvula midline; non-inflamed pharynx; no exudates or postnasal drainage Lungs:  clear to auscultation, breath sounds equal bilaterally, no respiratory distress Cardio:  regular rate and rhythm, 2+ pitting bilateral lower extremity edema tapering at the mid tibia Rectal: Deferred GI: BS+, S, NT, ND, no masses or organomegaly Musculoskeletal:  symmetrical muscle groups noted without atrophy or deformity Neuro: No cerebellar signs, grip strength adequate Psych: Normal affect and mood  Assessment and Plan  Well adult exam  Hyperlipidemia associated with type 2 diabetes mellitus ([place])  Stage 3 chronic kidney disease, unspecified  whether stage 3a or 3b CKD ([place]) - Plan: Ambulatory referral to Home Health  Physical deconditioning - Plan: Ambulatory referral to [facility]  Need for vaccination against Streptococcus pneumoniae - Plan: Pneumococcal conjugate vaccine 20-valent (Prevnar 76)   Well 87 y.o. male. Counseled on diet and exercise. Other orders as above. Patient has a history of diabetes, currently in remission we think.  He declines laboratory testing given his age and unwillingness to make any changes should there be any abnormalities. PCV 20 today.  Declines flu shot.  He will think about that shingles vaccine. Requested they scanned his advanced directive into MyChart. Declines any work-up/recommendations for lower extremity swelling but I did give him some in his paperwork. We will refer to home health to see if they can help out with an aide to help with bathing.  He is physically deconditioned and not walking/moving as well as he used to be. Follow up in 6 mo or prn.  The patient and his daughter voiced understanding and agreement to the plan.  [place], DO 12/28/20 12:00 PM

## 2020-12-28 NOTE — Patient Instructions (Addendum)
Keep the diet clean and stay active.  The new Shingrix vaccine (for shingles) is a 2 shot series. It can make people feel low energy, achy and almost like they have the flu for 48 hours after injection. Please plan accordingly when deciding on when to get this shot. Call your pharmacy to get this. The second shot of the series is less severe regarding the side effects, but it still lasts 48 hours.   Plz send me a message with your advanced directive scanned in.   If you do not hear anything about your referral in the next 1-2 weeks, call our office and ask for an update.  For the swelling in your lower extremities, be sure to elevate your legs when able, mind the salt intake, stay physically active and consider wearing compression stockings.  Let us know if you need anything.

## 2020-12-29 ENCOUNTER — Telehealth: Payer: Self-pay | Admitting: Family Medicine

## 2020-12-29 NOTE — Telephone Encounter (Signed)
Physical therapist did an assement today and isrequesting orders for twice a week for two weeks. Also home health aid twice a week for two week then once a week for 1 week. Daughter also wanted the dr to know pt is not longer taking pantoprazole.

## 2020-12-29 NOTE — Telephone Encounter (Signed)
Called informed of PCP verbal ok Updated medication list

## 2021-01-27 ENCOUNTER — Telehealth: Payer: Self-pay

## 2021-01-27 NOTE — Telephone Encounter (Signed)
VO given.

## 2021-01-27 NOTE — Telephone Encounter (Signed)
Caller/Agency: Roselie Awkward, PT w/[facility] Number: [phone] Requesting OT/PT/Skilled Nursing/Social Work/Speech Therapy: Roselie Awkward, PT with [facility] called stating pt fell last night, pt's R knee gave out on him.  Pt's daughter helped him up.  There was no major bruising noted.  Swelling is baseline.   Frequency: Roselie Awkward is requesting to extend care instead of discharging next week.  Would like 2 week 1, then 1 week 1.

## 2021-01-27 NOTE — Telephone Encounter (Signed)
OK 

## 2021-01-30 ENCOUNTER — Encounter: Payer: Self-pay | Admitting: Family Medicine

## 2021-01-31 ENCOUNTER — Telehealth: Payer: Self-pay | Admitting: Family Medicine

## 2021-01-31 NOTE — Telephone Encounter (Signed)
Home health requesting orders to help with showering. Once a week for 3 weeks. [phone]

## 2021-01-31 NOTE — Telephone Encounter (Signed)
HH informed of ok per PCP.

## 2021-03-14 ENCOUNTER — Other Ambulatory Visit: Payer: Self-pay | Admitting: Family Medicine

## 2021-03-14 ENCOUNTER — Encounter: Payer: Self-pay | Admitting: Family Medicine

## 2021-03-14 MED ORDER — ATENOLOL 25 MG PO TABS: 25.0000 mg | ORAL_TABLET | Freq: Every day | ORAL | 3 refills | Status: AC

## 2021-05-31 ENCOUNTER — Other Ambulatory Visit: Payer: Self-pay | Admitting: Family Medicine

## 2021-05-31 DIAGNOSIS — B0229 Other postherpetic nervous system involvement: Secondary | ICD-10-CM

## 2021-06-28 ENCOUNTER — Encounter: Payer: Self-pay | Admitting: Family Medicine

## 2021-06-28 ENCOUNTER — Ambulatory Visit (INDEPENDENT_AMBULATORY_CARE_PROVIDER_SITE_OTHER): Payer: Medicare HMO | Admitting: Family Medicine

## 2021-06-28 VITALS — BP 118/80 | HR 68 | Temp 98.0°F | Ht 68.0 in | Wt 186.0 lb

## 2021-06-28 DIAGNOSIS — I480 Paroxysmal atrial fibrillation: Secondary | ICD-10-CM | POA: Diagnosis not present

## 2021-06-28 DIAGNOSIS — R32 Unspecified urinary incontinence: Secondary | ICD-10-CM

## 2021-06-28 DIAGNOSIS — M1711 Unilateral primary osteoarthritis, right knee: Secondary | ICD-10-CM

## 2021-06-28 DIAGNOSIS — I10 Essential (primary) hypertension: Secondary | ICD-10-CM

## 2021-06-28 NOTE — Progress Notes (Signed)
Chief Complaint  Patient presents with   Follow-up    6 month     Subjective David Voorheis Sr. is a 88 y.o. male who presents for hypertension follow up. Here w daughter who helps w hx.  He does not monitor home blood pressures. He is compliant with medication- Norvasc 5 mg/d. Patient has these side effects of medication: none He is adhering to a healthy diet overall. Current exercise: walking No CP or SOB.  Incontinence Taking Ditropan XL 5 mg/d. Reports compliance, working well overall. No AE's. He has been urinating and then having to go shortly after.   Chronic knee OA Chronic knee OA, does not want to take NSAIDs or pain medication. Does well with Decadron 1 mg/d. Knows this is not ideal for long term tx, but given his age and quality of life, agreed to stay on this. Not interested in replacement, which he was told he needs.    Past Medical History:  Diagnosis Date   Hypertension     Exam BP 118/80   Pulse 68   Temp 98 F (36.7 C) (Oral)   Ht 5\' 8"  (1.727 m)   Wt 186 lb (84.4 kg)   SpO2 94%   BMI 28.28 kg/m  General:  well developed, well nourished, in no apparent distress Heart: RRR, no bruits, no LE edema Lungs: clear to auscultation, no accessory muscle use Psych: Normal affect/mood, limited judgment and insight  Essential hypertension  Urinary incontinence, unspecified type  Primary osteoarthritis of right knee  Paroxysmal atrial fibrillation (HCC)  Chronic, stable. Cont Norvasc 5 mg/d, atenolol 25 mg/d. Counseled on diet and exercise. Chronic, stable. Cont Ditropan XL 5 mg/d. Rec'd double voiding.  Chronic, stable. Cont Decadron 1 mg/d.  Chronic, stable. Cont Atenolol 25 mg/d.  Mentioned some loose stools, offered GI referral, rec'd heavier amounts of fiber.  [facility] rec'd for personal home health agency.  F/u in 6 mo prn. The patient and his daughter voiced understanding and agreement to the plan.  [place],  DO 06/28/21  11:27 AM

## 2021-06-28 NOTE — Patient Instructions (Addendum)
Keep the diet clean and stay active.  Start "double voiding". After initially stopping, stand up and wait a few seconds and sit down again to urinate. Perhaps try to keep a sign or note in the bathroom to help with the memory.   Artificial tears like Refresh and Systane may be used for comfort. OK to get generic version. Generally people use them every 2-4 hours, but you can use them as much as you want because there is no medication in it.  Take Metamucil or Benefiber daily.  Look into [facility] or [facility]  Let us know if you need anything.

## 2021-07-15 ENCOUNTER — Other Ambulatory Visit: Payer: Self-pay | Admitting: Family Medicine

## 2021-07-15 DIAGNOSIS — R32 Unspecified urinary incontinence: Secondary | ICD-10-CM

## 2021-09-05 ENCOUNTER — Other Ambulatory Visit: Payer: Self-pay | Admitting: Family Medicine

## 2021-11-28 ENCOUNTER — Telehealth: Payer: Self-pay | Admitting: Family Medicine

## 2021-11-28 NOTE — Telephone Encounter (Signed)
Left message for patient to call back and schedule Medicare Annual Wellness Visit (AWV) either virtually or phone   Left  my David Nichols number [phone]   Last AWV 11/22/20  please schedule with Nurse Health Adviser 2   45 min for awv-i and in office appointments 30 min for awv-s  phone/virtual appointments

## 2021-12-06 ENCOUNTER — Telehealth: Payer: Self-pay | Admitting: Family Medicine

## 2021-12-06 ENCOUNTER — Telehealth: Payer: Medicare HMO | Admitting: Family Medicine

## 2021-12-06 NOTE — Telephone Encounter (Signed)
Pt has been rescheduled to 12/15/2021 for AWV.

## 2021-12-06 NOTE — Progress Notes (Deleted)
PATIENT CHECK-IN and HEALTH RISK ASSESSMENT QUESTIONNAIRE:  -completed by phone/video for upcoming Medicare Preventive Visit  Pre-Visit Check-in: 1)Vitals (height, wt, BP, etc) - record in vitals section for visit on day of visit 2)Review and Update Medications, Allergies PMH, Surgeries, Social history in Epic 3)Hospitalizations in the last year with date/reason? ***  4)Review and Update Care Team (patient's specialists) in Epic 5) Complete PHQ9 in Epic  6) Complete Fall Screening in [place] Maintenance Due and order under PCP if not done.  Medicare Wellness Patient Questionnaire:  Answer theses question about your habits: Do you drink alcohol? *** If yes, how many drinks do you have a day?*** Have you ever smoked?*** Quit date if applicable? ***  How many packs a day do/did you smoke? *** Do you use smokeless tobacco?*** Do you use an illicit drugs?*** Do you exercises? ***IF so, what type and how many days/minutes per week?*** Are you sexually active? ***Number of partners?*** Typical breakfast**** Typical lunch*** Typical dinner*** Typical snacks:****  Beverages: ***  Answer theses question about you: Can you perform most household chores?*** Do you find it hard to follow a conversation in a noisy room?*** Do you often ask people to speak up or repeat themselves?*** Do you feel that you have a problem with memory?*** Do you balance your checkbook and or bank acounts?*** Do you feel safe at home?*** Last dentist visit?*** Do you need assistance with any of the following: Please note if so ***  Driving?  Feeding yourself?  Getting from bed to chair?  Getting to the toilet?  Bathing or showering?  Dressing yourself?  Managing money?  Climbing a flight of stairs  Preparing meals?    Do you have Advanced Directives in place (Living Will, Healthcare Power or Attorney)? ***   Last eye Exam and location?***   Do you currently use prescribed or  non-prescribed narcotic or opioid pain medications?***  Do you have a history or close family history of breast, ovarian, tubal or peritoneal cancer or a family member with BRCA (breast cancer susceptibility 1 and 2) gene mutations?  Nurse/Assistant Credentials/time stamp:   ----------------------------------------------------------------------------------------------------------------------------------------------------------------------------------------------------------------------    MEDICARE ANNUAL PREVENTIVE CARE VISIT WITH PROVIDER (Welcome to Medicare, initial annual wellness or annual wellness exam)  Virtual Visit via Video Note  I connected with@  on _0 @ by a video enabled telemedicine application and verified that I am speaking with the correct person using two identifiers.  Location patient: home Location provider:work or home office Persons participating in the virtual visit: patient, provider  Concerns and/or follow up today:   See HM section in Epic for other details of completed HM.    ROS: negative for report of fevers, unintentional weight loss, vision changes, vision loss, hearing loss or change, chest pain, sob, hemoptysis, melena, hematochezia, hematuria, genital discharge or lesions, falls, bleeding or bruising, loc, thoughts of suicide or self harm, memory loss  Patient-completed extensive health risk assessment - reviewed and discussed with the patient: See Health Risk Assessment completed with patient prior to the visit either above or in recent phone note. This was reviewed in detailed with the patient today and appropriate recommendations, orders and referrals were placed as needed per Summary below and patient instructions.   Review of Medical History: -PMH, PSH, Family History and current specialty and care providers reviewed and updated and listed below   Patient Care Team: Shelda Pal, DO as PCP - General (Family Medicine)   Past  Medical History:  Diagnosis Date  Hypertension     Past Surgical History:  Procedure Laterality Date   APPENDECTOMY     HEMORROIDECTOMY     TONSILLECTOMY      Social History   Socioeconomic History   Marital status: Widowed    Spouse name: Not on file   Number of children: Not on file   Years of education: Not on file   Highest education level: Not on file  Occupational History   Not on file  Tobacco Use   Smoking status: Never   Smokeless tobacco: Never  Substance and Sexual Activity   Alcohol use: Not Currently   Drug use: Never   Sexual activity: Not on file  Other Topics Concern   Not on file  Social History Narrative   Not on file   Social Determinants of Health   Financial Resource Strain: Low Risk  (11/22/2020)   Overall Financial Resource Strain (CARDIA)    Difficulty of Paying Living Expenses: Not hard at all  Food Insecurity: No Food Insecurity (11/22/2020)   Hunger Vital Sign    Worried About Running Out of Food in the Last Year: Never true    [place] in the Last Year: Never true  Transportation Needs: No Transportation Needs (11/22/2020)   PRAPARE - Hydrologist (Medical): No    Lack of Transportation (Non-Medical): No  Physical Activity: Inactive (11/22/2020)   Exercise Vital Sign    Days of Exercise per Week: 0 days    Minutes of Exercise per Session: 0 min  Stress: No Stress Concern Present (11/22/2020)   [place]    Feeling of Stress : Not at all  Social Connections: Socially Isolated (11/22/2020)   Social Connection and Isolation Panel [NHANES]    Frequency of Communication with Friends and Family: More than three times a week    Frequency of Social Gatherings with Friends and Family: More than three times a week    Attends Religious Services: Never    Marine scientist or Organizations: No    Attends Archivist Meetings:  Never    Marital Status: Widowed  Intimate Partner Violence: Not At Risk (11/22/2020)   Humiliation, Afraid, Rape, and Kick questionnaire    Fear of Current or Ex-Partner: No    Emotionally Abused: No    Physically Abused: No    Sexually Abused: No    Family History  Problem Relation Age of Onset   Heart attack Mother 36       Lived to age 50   Heart attack Father     Current Outpatient Medications on File Prior to Visit  Medication Sig Dispense Refill   amLODipine (NORVASC) 5 MG tablet TAKE 1 TABLET BY MOUTH DAILY 90 tablet 2   atenolol (TENORMIN) 25 MG tablet Take 1 tablet (25 mg total) by mouth daily. 90 tablet 3   Calcium Citrate (CITRACAL PO) Take 2 capsules by mouth 2 (two) times daily.     dexamethasone (DECADRON) 1 MG tablet Take 1 tab every other day. 90 tablet 2   LORazepam (ATIVAN) 0.5 MG tablet Take 0.5 mg by mouth every 4 (four) hours as needed for anxiety.     mirtazapine (REMERON) 7.5 MG tablet TAKE 1 TABLET BY MOUTH AT BEDTIME 90 tablet 1   nystatin (MYCOSTATIN/NYSTOP) powder Apply 1 application topically 2 (two) times daily as needed (skin irritation). 15 g 1   oxybutynin (DITROPAN-XL) 5 MG  24 hr tablet TAKE ONE TABLET BY MOUTH EVERY NIGHT AT BEDTIME 90 tablet 2   senna-docusate (SENOKOT-S) 8.6-50 MG tablet Take 1-2 tablets by mouth daily as needed for constipation. As needed.     venlafaxine XR (EFFEXOR-XR) 37.5 MG 24 hr capsule TAKE ONE CAPSULE BY MOUTH DAILY WITH BREAKFAST 90 capsule 2   [DISCONTINUED] lisinopril (ZESTRIL) 10 MG tablet Take 10 mg by mouth daily.     No current facility-administered medications on file prior to visit.    No Known Allergies     Physical Exam There were no vitals filed for this visit. Estimated body mass index is 28.28 kg/m as calculated from the following:   Height as of 06/28/21: 5' 8" (1.727 m).   Weight as of 06/28/21: 186 lb (84.4 kg).  EKG (optional): deferred due to virtual visit  GENERAL: alert, oriented, appears  well and in no acute distress; visual acuity grossly intact, full vision exam deferred due to pandemic and/or virtual encounter  HEENT: atraumatic, conjunttiva clear, no obvious abnormalities on inspection of external nose and ears  NECK: normal movements of the head and neck  LUNGS: on inspection no signs of respiratory distress, breathing rate appears normal, no obvious gross SOB, gasping or wheezing  CV: no obvious cyanosis  MS: moves all visible extremities without noticeable abnormality  PSYCH/NEURO: pleasant and cooperative, no obvious depression or anxiety, speech and thought processing grossly intact, Cognitive function grossly intact  [employer] Visit from 06/28/2021 in [facility] [facility]  PHQ-9 Total Score 2           06/28/2021   11:06 AM 11/22/2020    9:59 AM 05/28/2020   11:03 AM  Depression screen PHQ 2/9  Decreased Interest 0 0 0  Down, Depressed, Hopeless 0 0 0  PHQ - 2 Score 0 0 0  Altered sleeping 2    Tired, decreased energy 0    Change in appetite 0    Feeling bad or failure about yourself  0    Trouble concentrating 0    Moving slowly or fidgety/restless 0    Suicidal thoughts 0    PHQ-9 Score 2    Difficult doing work/chores Not difficult at all         07/20/2019    8:58 AM 01/03/2020   11:00 PM 06/28/2020   11:08 AM 11/22/2020    9:54 AM 06/28/2021   11:06 AM  Fall Risk  Falls in the past year?   1 0 1  Was there an injury with Fall?   0 0 0  Fall Risk Category Calculator   1 0 2  Fall Risk Category   Low Low Moderate  Patient Fall Risk Level Low fall risk High fall risk Moderate fall risk Low fall risk Low fall risk  Patient Fall Risk Level - Comments  dizzy last night     Patient at Risk for Falls Due to   No Fall Risks;History of fall(s)  No Fall Risks  Fall risk Follow up   Falls evaluation completed Falls prevention discussed Falls evaluation completed     SUMMARY AND PLAN:  No diagnosis  found.  Visit coding *** [phone] (annual wellness visit -initial); G0439 (annual wellness subsequent); G0402 Welcome to Medicare(initial preventive physical exam)   Discussed applicable health maintenance/preventive health measures and advised and referred or ordered per patient preferences:  Health Maintenance  Topic Date Due   INFLUENZA VACCINE  Never done  Medicare Annual Wellness (AWV)  11/22/2021   Zoster Vaccines- Shingrix (1 of 2) 12/28/2021 (Originally 11/29/1982)   Pneumonia Vaccine 93+ Years old  Completed   HPV VACCINES  Aged Out   FOOT EXAM  Discontinued   HEMOGLOBIN A1C  Discontinued   OPHTHALMOLOGY EXAM  Discontinued   COVID-19 Vaccine  Discontinued    Vaccines  Lung Cancer Screening if applicable  Colorectal cancer screening   Screening for glaucoma if applicable  Statin use for primary prevention if applicable  Cardiovascular screening blood tests if applicable  Diabetes screening tests if applicable  Hepatitis B screening if applicable  HIV screening   PrEP if applicable  Hepatitis C screening   Chlamydia/Gonorrhea screening  STI Screening   Syphilis Screening if applicable  Abdominal Aortic Aneurysm Screening if applicable  Prostate cancer screening with PSA if applicable  Education and counseling on the following was provided based on the above review of health and a plan/checklist for the patient, along with additional information discussed, was provided for the patient in the patient instructions :  -Advised on importance of and resources for completing advanced directives -Provided counseling and plan for difficulty hearing discussed/referral to audiology or ENT if applicable per above screening. -Provided counseling and plan for increased risk of falling if applicable per above screening. (Referral for PT, community based exercise programs, etc.) -Provided counseling and plan for function difficulties/ difficulties with ADLs if applicable  per above screening. -Advised and counseled on maintaining healthy weight and healthy lifestyle - including the importance of a health diet, regular physical activity, social connections and stress management. -Advised and counseled on a whole foods based healthy diet and regular exercise: discussed a heart healthy whole foods based diet at length. A summary of a healthy diet was provided in the Patient Instructions.Offered referral to dietician/weight management clinic if applicable and follow up virtual visits to assist further and monitor progress. Recommended regular exercise and discussed options within the community.  -Advised yearly dental visits at minimum and regular eye exams -Advised and counseled on alcohol, tobacco, drug, opoid use/misuse if applicable and options for help if applicable.  Follow up: see patient instructions   There are no Patient Instructions on file for this visit.  David Kern, DO

## 2021-12-06 NOTE — Telephone Encounter (Addendum)
Please call Pt back as soon as possible to reschedule his appointment for today - 12/06/21 at 3:40 pm. Pt's chart was opened by someone else and I could not access at that moment to reschedule. Also, I did not want to reschedule incorrectly.

## 2021-12-15 ENCOUNTER — Ambulatory Visit (INDEPENDENT_AMBULATORY_CARE_PROVIDER_SITE_OTHER): Payer: Medicare HMO | Admitting: *Deleted

## 2021-12-15 DIAGNOSIS — Z0000 Encounter for general adult medical examination without abnormal findings: Secondary | ICD-10-CM

## 2021-12-15 NOTE — Patient Instructions (Signed)
David Nichols , Thank you for taking time to come for your Medicare Wellness Visit. I appreciate your ongoing commitment to your health goals. Please review the following plan we discussed and let me know if I can assist you in the future.   These are the goals we discussed:  Goals      Patient Stated     Maintain current health        This is a list of the screening recommended for you and due dates:  Health Maintenance  Topic Date Due   DTaP/Tdap/Td vaccine (1 - Tdap) 01/11/2008   Flu Shot  Never done   Zoster (Shingles) Vaccine (1 of 2) 12/28/2021*   Medicare Annual Wellness Visit  12/16/2022   Pneumonia Vaccine  Completed   HPV Vaccine  Aged Out   Complete foot exam   Discontinued   Hemoglobin A1C  Discontinued   Eye exam for diabetics  Discontinued   COVID-19 Vaccine  Discontinued  *Topic was postponed. The date shown is not the original due date.     Next appointment: Follow up in one year for your annual wellness visit.   Preventive Care 43 Years and Older, Male Preventive care refers to lifestyle choices and visits with your health care provider that can promote health and wellness. What does preventive care include? A yearly physical exam. This is also called an annual well check. Dental exams once or twice a year. Routine eye exams. Ask your health care provider how often you should have your eyes checked. Personal lifestyle choices, including: Daily care of your teeth and gums. Regular physical activity. Eating a healthy diet. Avoiding tobacco and drug use. Limiting alcohol use. Practicing safe sex. Taking low doses of aspirin every day. Taking vitamin and mineral supplements as recommended by your health care provider. What happens during an annual well check? The services and screenings done by your health care provider during your annual well check will depend on your age, overall health, lifestyle risk factors, and family history of disease. Counseling   Your health care provider may ask you questions about your: Alcohol use. Tobacco use. Drug use. Emotional well-being. Home and relationship well-being. Sexual activity. Eating habits. History of falls. Memory and ability to understand (cognition). Work and work Statistician. Screening  You may have the following tests or measurements: Height, weight, and BMI. Blood pressure. Lipid and cholesterol levels. These may be checked every 5 years, or more frequently if you are over 18 years old. Skin check. Lung cancer screening. You may have this screening every year starting at age 58 if you have a 30-pack-year history of smoking and currently smoke or have quit within the past 15 years. Fecal occult blood test (FOBT) of the stool. You may have this test every year starting at age 89. Flexible sigmoidoscopy or colonoscopy. You may have a sigmoidoscopy every 5 years or a colonoscopy every 10 years starting at age 35. Prostate cancer screening. Recommendations will vary depending on your family history and other risks. Hepatitis C blood test. Hepatitis B blood test. Sexually transmitted disease (STD) testing. Diabetes screening. This is done by checking your blood sugar (glucose) after you have not eaten for a while (fasting). You may have this done every 1-3 years. Abdominal aortic aneurysm (AAA) screening. You may need this if you are a current or former smoker. Osteoporosis. You may be screened starting at age 58 if you are at high risk. Talk with your health care provider about your test  results, treatment options, and if necessary, the need for more tests. Vaccines  Your health care provider may recommend certain vaccines, such as: Influenza vaccine. This is recommended every year. Tetanus, diphtheria, and acellular pertussis (Tdap, Td) vaccine. You may need a Td booster every 10 years. Zoster vaccine. You may need this after age 53. Pneumococcal 13-valent conjugate (PCV13) vaccine.  One dose is recommended after age 44. Pneumococcal polysaccharide (PPSV23) vaccine. One dose is recommended after age 49. Talk to your health care provider about which screenings and vaccines you need and how often you need them. This information is not intended to replace advice given to you by your health care provider. Make sure you discuss any questions you have with your health care provider. Document Released: 01/22/2015 Document Revised: 09/15/2015 Document Reviewed: 10/27/2014 Elsevier Interactive Patient Education  2017 [employer] Prevention in the Home Falls can cause injuries. They can happen to people of all ages. There are many things you can do to make your home safe and to help prevent falls. What can I do on the outside of my home? Regularly fix the edges of walkways and driveways and fix any cracks. Remove anything that might make you trip as you walk through a door, such as a raised step or threshold. Trim any bushes or trees on the path to your home. Use bright outdoor lighting. Clear any walking paths of anything that might make someone trip, such as rocks or tools. Regularly check to see if handrails are loose or broken. Make sure that both sides of any steps have handrails. Any raised decks and porches should have guardrails on the edges. Have any leaves, snow, or ice cleared regularly. Use sand or salt on walking paths during winter. Clean up any spills in your garage right away. This includes oil or grease spills. What can I do in the bathroom? Use night lights. Install grab bars by the toilet and in the tub and shower. Do not use towel bars as grab bars. Use non-skid mats or decals in the tub or shower. If you need to sit down in the shower, use a plastic, non-slip stool. Keep the floor dry. Clean up any water that spills on the floor as soon as it happens. Remove soap buildup in the tub or shower regularly. Attach bath mats securely with double-sided  non-slip rug tape. Do not have throw rugs and other things on the floor that can make you trip. What can I do in the bedroom? Use night lights. Make sure that you have a light by your bed that is easy to reach. Do not use any sheets or blankets that are too big for your bed. They should not hang down onto the floor. Have a firm chair that has side arms. You can use this for support while you get dressed. Do not have throw rugs and other things on the floor that can make you trip. What can I do in the kitchen? Clean up any spills right away. Avoid walking on wet floors. Keep items that you use a lot in easy-to-reach places. If you need to reach something above you, use a strong step stool that has a grab bar. Keep electrical cords out of the way. Do not use floor polish or wax that makes floors slippery. If you must use wax, use non-skid floor wax. Do not have throw rugs and other things on the floor that can make you trip. What can I do with my stairs?  Do not leave any items on the stairs. Make sure that there are handrails on both sides of the stairs and use them. Fix handrails that are broken or loose. Make sure that handrails are as long as the stairways. Check any carpeting to make sure that it is firmly attached to the stairs. Fix any carpet that is loose or worn. Avoid having throw rugs at the top or bottom of the stairs. If you do have throw rugs, attach them to the floor with carpet tape. Make sure that you have a light switch at the top of the stairs and the bottom of the stairs. If you do not have them, ask someone to add them for you. What else can I do to help prevent falls? Wear shoes that: Do not have high heels. Have rubber bottoms. Are comfortable and fit you well. Are closed at the toe. Do not wear sandals. If you use a stepladder: Make sure that it is fully opened. Do not climb a closed stepladder. Make sure that both sides of the stepladder are locked into place. Ask  someone to hold it for you, if possible. Clearly mark and make sure that you can see: Any grab bars or handrails. First and last steps. Where the edge of each step is. Use tools that help you move around (mobility aids) if they are needed. These include: Canes. Walkers. Scooters. Crutches. Turn on the lights when you go into a dark area. Replace any light bulbs as soon as they burn out. Set up your furniture so you have a clear path. Avoid moving your furniture around. If any of your floors are uneven, fix them. If there are any pets around you, be aware of where they are. Review your medicines with your doctor. Some medicines can make you feel dizzy. This can increase your chance of falling. Ask your doctor what other things that you can do to help prevent falls. This information is not intended to replace advice given to you by your health care provider. Make sure you discuss any questions you have with your health care provider. Document Released: 10/22/2008 Document Revised: 06/03/2015 Document Reviewed: 01/30/2014 Elsevier Interactive Patient Education  2017 [employer].

## 2021-12-15 NOTE — Progress Notes (Signed)
Subjective:   David Elpers Sr. is a 88 y.o. male who presents for Medicare Annual/Subsequent preventive examination.  I connected with  David Teal Sr. on 12/15/21 by a audio enabled telemedicine application and verified that I am speaking with the correct person using two identifiers.  Patient Location: Home  Provider Location: Office/Clinic  I discussed the limitations of evaluation and management by telemedicine. The patient expressed understanding and agreed to proceed.   Review of Systems    Defer to PCP Cardiac Risk Factors include: advanced age (>64men, >63 women);male gender;hypertension;dyslipidemia;diabetes mellitus     Objective:    There were no vitals filed for this visit. There is no height or weight on file to calculate BMI.     12/15/2021    3:10 PM 11/22/2020    9:50 AM 01/03/2020   10:59 PM 07/20/2019    8:58 AM 06/15/2018    9:55 AM  Advanced Directives  Does Patient Have a Medical Advance Directive? Yes Yes Yes No No  Type of Paramedic of [place];Living will [place];Living will Living will    Does patient want to make changes to medical advance directive? No - Patient declined      Copy of [place] in Chart? Yes - validated most recent copy scanned in chart (See row information) No - copy requested       Current Medications (verified) Outpatient Encounter Medications as of 12/15/2021  Medication Sig   amLODipine (NORVASC) 5 MG tablet TAKE 1 TABLET BY MOUTH DAILY   atenolol (TENORMIN) 25 MG tablet Take 1 tablet (25 mg total) by mouth daily.   Calcium Citrate (CITRACAL PO) Take 2 capsules by mouth 2 (two) times daily.   dexamethasone (DECADRON) 1 MG tablet Take 1 tab every other day.   LORazepam (ATIVAN) 0.5 MG tablet Take 0.5 mg by mouth every 4 (four) hours as needed for anxiety.   mirtazapine (REMERON) 7.5 MG tablet TAKE 1 TABLET BY MOUTH AT BEDTIME   nystatin (MYCOSTATIN/NYSTOP) powder  Apply 1 application topically 2 (two) times daily as needed (skin irritation).   oxybutynin (DITROPAN-XL) 5 MG 24 hr tablet TAKE ONE TABLET BY MOUTH EVERY NIGHT AT BEDTIME   senna-docusate (SENOKOT-S) 8.6-50 MG tablet Take 1-2 tablets by mouth daily as needed for constipation. As needed.   venlafaxine XR (EFFEXOR-XR) 37.5 MG 24 hr capsule TAKE ONE CAPSULE BY MOUTH DAILY WITH BREAKFAST   [DISCONTINUED] lisinopril (ZESTRIL) 10 MG tablet Take 10 mg by mouth daily.   No facility-administered encounter medications on file as of 12/15/2021.    Allergies (verified) Patient has no known allergies.   History: Past Medical History:  Diagnosis Date   Hypertension    Past Surgical History:  Procedure Laterality Date   APPENDECTOMY     HEMORROIDECTOMY     TONSILLECTOMY     Family History  Problem Relation Age of Onset   Heart attack Mother 83       Lived to age 93   Heart attack Father    Social History   Socioeconomic History   Marital status: Widowed    Spouse name: Not on file   Number of children: Not on file   Years of education: Not on file   Highest education level: Not on file  Occupational History   Not on file  Tobacco Use   Smoking status: Never   Smokeless tobacco: Never  Substance and Sexual Activity   Alcohol use: Not Currently  Drug use: Never   Sexual activity: Not on file  Other Topics Concern   Not on file  Social History Narrative   Not on file   Social Determinants of Health   Financial Resource Strain: Low Risk  (11/22/2020)   Overall Financial Resource Strain (CARDIA)    Difficulty of Paying Living Expenses: Not hard at all  Food Insecurity: No Food Insecurity (12/15/2021)   Hunger Vital Sign    Worried About Running Out of Food in the Last Year: Never true    Ran Out of Food in the Last Year: Never true  Transportation Needs: No Transportation Needs (12/15/2021)   PRAPARE - Hydrologist (Medical): No    Lack of  Transportation (Non-Medical): No  Physical Activity: Inactive (11/22/2020)   Exercise Vital Sign    Days of Exercise per Week: 0 days    Minutes of Exercise per Session: 0 min  Stress: No Stress Concern Present (11/22/2020)   [place]    Feeling of Stress : Not at all  Social Connections: Socially Isolated (11/22/2020)   Social Connection and Isolation Panel [NHANES]    Frequency of Communication with Friends and Family: More than three times a week    Frequency of Social Gatherings with Friends and Family: More than three times a week    Attends Religious Services: Never    Marine scientist or Organizations: No    Attends Archivist Meetings: Never    Marital Status: Widowed    Tobacco Counseling Counseling given: Not Answered   Clinical Intake:  Pre-visit preparation completed: Yes  Pain : No/denies pain  How often do you need to have someone help you when you read instructions, pamphlets, or other written materials from your doctor or pharmacy?: 1 - Never  Diabetic? Nutrition Risk Assessment:  Has the patient had any N/V/D within the last 2 months?  No  Does the patient have any non-healing wounds?  No  Has the patient had any unintentional weight loss or weight gain?  No   Diabetes:  Is the patient diabetic?  Yes  If diabetic, was a CBG obtained today?  No  Did the patient bring in their glucometer from home?  No  How often do you monitor your CBG's? never.   Financial Strains and Diabetes Management:  Are you having any financial strains with the device, your supplies or your medication? No .  Does the patient want to be seen by Chronic Care Management for management of their diabetes?  No  Would the patient like to be referred to a Nutritionist or for Diabetic Management?  No   Diabetic Exams:  Diabetic Eye Exam: Overdue for diabetic eye exam. Pt has been advised about the  importance in completing this exam. Patient advised to call and schedule an eye exam. Diabetic Foot Exam: Overdue, Pt has been advised about the importance in completing this exam. Pt is scheduled for diabetic foot exam on n/a.  Interpreter Needed?: No  Information entered by :: Beatris Ship, [place]   Activities of Daily Living    12/15/2021    3:13 PM  In your present state of health, do you have any difficulty performing the following activities:  Hearing? 0  Vision? 0  Difficulty concentrating or making decisions? 1  Walking or climbing stairs? 1  Dressing or bathing? 1  Doing errands, shopping? 1  Preparing Food and eating ? David Nichols  Using the Toilet? Y  In the past six months, have you accidently leaked urine? Y  Do you have problems with loss of bowel control? Y  Managing your Medications? Y  Managing your Finances? Y  Housekeeping or managing your Housekeeping? Y    Patient Care Team: Shelda Pal, DO as PCP - General (Family Medicine)  Indicate any recent Medical Services you may have received from other than [facility] providers in the past year (date may be approximate).     Assessment:   This is a routine wellness examination for [place].  Hearing/Vision screen No results found.  Dietary issues and exercise activities discussed: Current Exercise Habits: The patient does not participate in regular exercise at present, Exercise limited by: None identified   Goals Addressed   None    Depression Screen    12/15/2021    3:12 PM 06/28/2021   11:06 AM 11/22/2020    9:59 AM 05/28/2020   11:03 AM  PHQ 2/9 Scores  PHQ - 2 Score  0 0 0  PHQ- 9 Score  2    Exception Documentation Other- indicate reason in comment box     Not completed unable to answer questions       Fall Risk    12/15/2021    3:10 PM 06/28/2021   11:06 AM 11/22/2020    9:54 AM 06/28/2020   11:08 AM  Fall Risk   Falls in the past year? 1 1 0 1  Number falls in past yr: 1 1 0 0  Injury with Fall?  0 0 0 0  Risk for fall due to : Impaired balance/gait;Impaired mobility;History of fall(s);Mental status change No Fall Risks  No Fall Risks;History of fall(s)  Follow up Falls evaluation completed Falls evaluation completed Falls prevention discussed Falls evaluation completed    FALL RISK PREVENTION PERTAINING TO THE HOME:  Any stairs in or around the home? No  If so, are there any without handrails? No  Home free of loose throw rugs in walkways, pet beds, electrical cords, etc? No  Adequate lighting in your home to reduce risk of falls? No   ASSISTIVE DEVICES UTILIZED TO PREVENT FALLS:  Life alert? No  Use of a cane, walker or w/c? Yes  Grab bars in the bathroom? No  Shower chair or bench in shower? Yes  Elevated toilet seat or a handicapped toilet? Yes   TIMED UP AND GO:  Was the test performed?  No, audio visit .    Cognitive Function:    12/15/2021    3:16 PM  MMSE - Mini Mental State Exam  Not completed: Unable to complete        Immunizations Immunization History  Administered Date(s) Administered   PNEUMOCOCCAL CONJUGATE-20 12/28/2020   Td (Adult),5 Lf Tetanus Toxid, Preservative Free 01/10/2008    TDAP status: Due, Education has been provided regarding the importance of this vaccine. Advised may receive this vaccine at local pharmacy or Health Dept. Aware to provide a copy of the vaccination record if obtained from local pharmacy or Health Dept. Verbalized acceptance and understanding.  Flu Vaccine status: Due, Education has been provided regarding the importance of this vaccine. Advised may receive this vaccine at local pharmacy or Health Dept. Aware to provide a copy of the vaccination record if obtained from local pharmacy or Health Dept. Verbalized acceptance and understanding.  Pneumococcal vaccine status: Up to date  Covid-19 vaccine status: Declined, Education has been provided regarding the importance of this vaccine but  patient still declined. Advised  may receive this vaccine at local pharmacy or Health Dept.or vaccine clinic. Aware to provide a copy of the vaccination record if obtained from local pharmacy or Health Dept. Verbalized acceptance and understanding.  Qualifies for Shingles Vaccine? Yes   Zostavax completed No   Shingrix Completed?: No.    Education has been provided regarding the importance of this vaccine. Patient has been advised to call insurance company to determine out of pocket expense if they have not yet received this vaccine. Advised may also receive vaccine at local pharmacy or Health Dept. Verbalized acceptance and understanding.  Screening Tests Health Maintenance  Topic Date Due   DTaP/Tdap/Td (1 - Tdap) 01/11/2008   INFLUENZA VACCINE  Never done   Medicare Annual Wellness (AWV)  11/22/2021   Zoster Vaccines- Shingrix (1 of 2) 12/28/2021 (Originally [birth year]1984)   Pneumonia Vaccine 36+ Years old  Completed   HPV VACCINES  Aged Out   FOOT EXAM  Discontinued   HEMOGLOBIN A1C  Discontinued   OPHTHALMOLOGY EXAM  Discontinued   COVID-19 Vaccine  Discontinued    Health Maintenance  Health Maintenance Due  Topic Date Due   DTaP/Tdap/Td (1 - Tdap) 01/11/2008   INFLUENZA VACCINE  Never done   Medicare Annual Wellness (AWV)  11/22/2021    Colorectal cancer screening: No longer required.   Lung Cancer Screening: (Low Dose CT Chest recommended if Age 70-80 years, 30 pack-year currently smoking OR have quit w/in 15years.) does not qualify.   Additional Screening:  Hepatitis C Screening: does not qualify  Vision Screening: Recommended annual ophthalmology exams for early detection of glaucoma and other disorders of the eye. Is the patient up to date with their annual eye exam?  No  Who is the provider or what is the name of the office in which the patient attends annual eye exams? None    Dental Screening: Recommended annual dental exams for proper oral hygiene  Community Resource Referral / Chronic Care  Management: CRR required this visit?  No   CCM required this visit?  No      Plan:     I have personally reviewed and noted the following in the patient's chart:   Medical and social history Use of alcohol, tobacco or illicit drugs  Current medications and supplements including opioid prescriptions. Patient is not currently taking opioid prescriptions. Functional ability and status Nutritional status Physical activity Advanced directives List of other physicians Hospitalizations, surgeries, and ER visits in previous 12 months Vitals Screenings to include cognitive, depression, and falls Referrals and appointments  In addition, I have reviewed and discussed with patient certain preventive protocols, quality metrics, and best practice recommendations. A written personalized care plan for preventive services as well as general preventive health recommendations were provided to patient.   Due to this being a telephonic visit, the after visit summary with patients personalized plan was offered to patient via mail or my-chart.  Patient would like to access on my-chart.   Beatris Ship, [place]   12/15/2021   Nurse Notes: None

## 2021-12-16 ENCOUNTER — Telehealth: Payer: Self-pay | Admitting: Family Medicine

## 2021-12-16 NOTE — Telephone Encounter (Signed)
Marina from [facility] received notification from family requesting hospice services and is asking if Dr. Nani Ravens can be the hospice attending. Lenda Kelp is calling to see if Dr. Nani Ravens wants to serve as hospice attending. She also wants to know if he wants to sign comfort care order or if he wants their physician to sign them.   Lenda Kelp also wanted to add the oral certification of terminal illness stating patient has life expectancy of terminal illness of 6 months or less if illness runs its normal course. She would like to confirmation that he agrees with above.

## 2021-12-19 ENCOUNTER — Telehealth: Payer: Self-pay | Admitting: Family Medicine

## 2021-12-19 NOTE — Telephone Encounter (Signed)
David Nichols ([facility]) calling to inform PCP of pt passing on 12.11.23 @ 2:01p.

## 2021-12-26 ENCOUNTER — Telehealth: Payer: Self-pay | Admitting: Family Medicine

## 2021-12-26 NOTE — Telephone Encounter (Signed)
Spoke to the patients daughter Renella Cunas away around noon on 2022/04/11 12/19/21 Patient lost function/speech since Wednesday Had not eaten a week prior to passing Was having facial weakness/mobility since Wednesday. Friday/Saturday and Sunday not able to get out of the bed. Daughter seems to think he may have had some strokes

## 2021-12-26 NOTE — Telephone Encounter (Signed)
Called back ([phone]-Back line after 5) Left message to call back.

## 2021-12-26 NOTE — Telephone Encounter (Signed)
David Nichols with [facility] in [place] called to follow up on death certificate that was sent to Dr. Nani Ravens on 12/12 electronically. She provided the case # [id number]. Her callback is [phone]

## 2021-12-26 NOTE — Telephone Encounter (Signed)
David Nichols (daughter) called back with info. All CMAs were unavailable at the time and advised David Nichols would giver her a call back later. David Nichols acknowledged understanding.

## 2021-12-26 NOTE — Telephone Encounter (Signed)
Called the daughter left message to call back.

## 2021-12-27 NOTE — Telephone Encounter (Signed)
Called the funeral home back and for some reason it had kicked everything out. She  did it again. So try again

## 2021-12-27 NOTE — Telephone Encounter (Signed)
Called left message to call back 

## 2021-12-27 NOTE — Telephone Encounter (Signed)
Spoke to [place] at the [facility] and she said it is on [employer]

## 2021-12-27 NOTE — Telephone Encounter (Signed)
She said it is under [employer]rion Menz Klosinski Sr. Case # [id number] She said you can just put in the case number and it should pop right up

## 2021-12-27 NOTE — Telephone Encounter (Signed)
Funeral home informed

## 2021-12-27 NOTE — Telephone Encounter (Signed)
Death certificate filled out online to the best of my ability with the knowledge available to me.

## 2021-12-28 ENCOUNTER — Encounter: Payer: Medicare HMO | Admitting: Family Medicine

## 2021-12-29 ENCOUNTER — Encounter: Payer: Medicare HMO | Admitting: Family Medicine

## 2022-08-02 IMAGING — CT CT ANGIO CHEST
2 of 8 series · 18 of 36 positions shown · IV contrast (Omnipaque)
Comparison: Chest x-ray 01/03/2020.

CLINICAL DATA: Chest pain insert 4 days ago after his wife passed
away. currently has a shingles flare up on his "waist"

EXAM:
CT ANGIOGRAPHY CHEST WITH CONTRAST
TECHNIQUE: Multidetector CT imaging of the chest was performed using the
standard protocol during bolus administration of intravenous
contrast. Multiplanar CT image reconstructions and MIPs were
obtained to evaluate the vascular anatomy.
CONTRAST:  80mL OMNIPAQUE IOHEXOL 350 MG/ML SOLN

[Series 6: pe coronal mpr · coronal · 0.62mm/px · 1 of 122 slices shown]
[im 61/122  mediastinal]
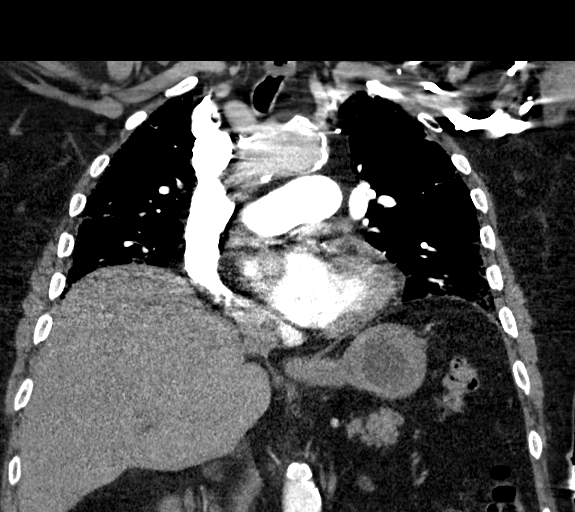

[Series 10: pe thins · axial · 0.70mm/px · z∈[-46,+238]mm · 17 of 318 slices shown]
[im 17/318  lung]
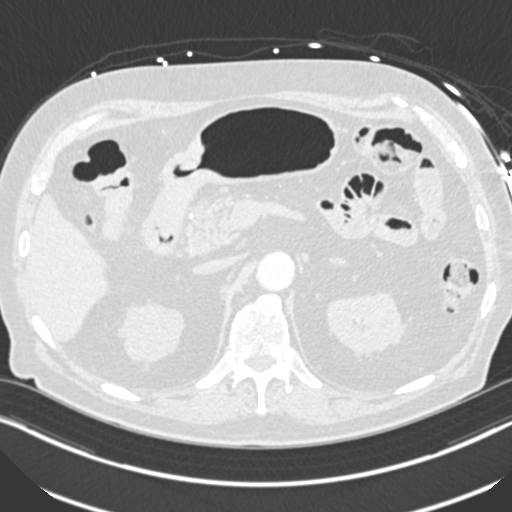
[im 34/318  mediastinal]
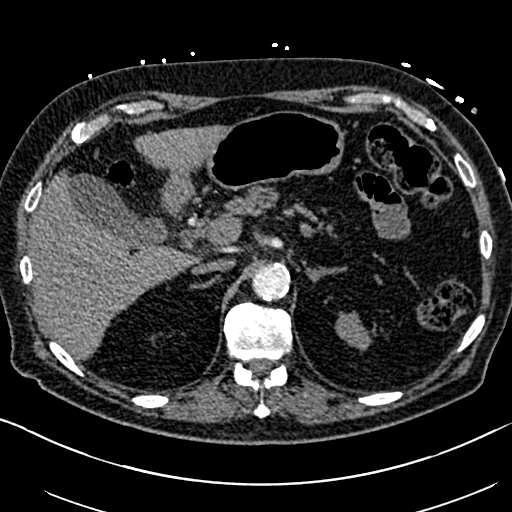
[im 51/318  lung]
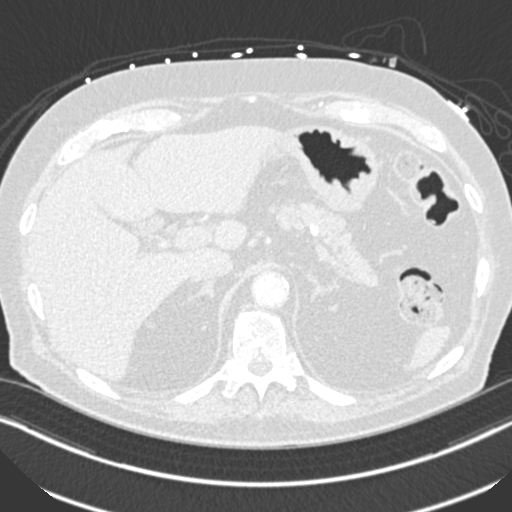
[im 67/318  mediastinal]
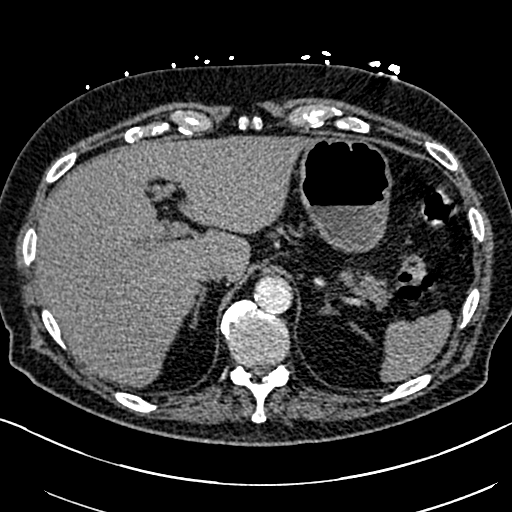
[im 84/318  lung]
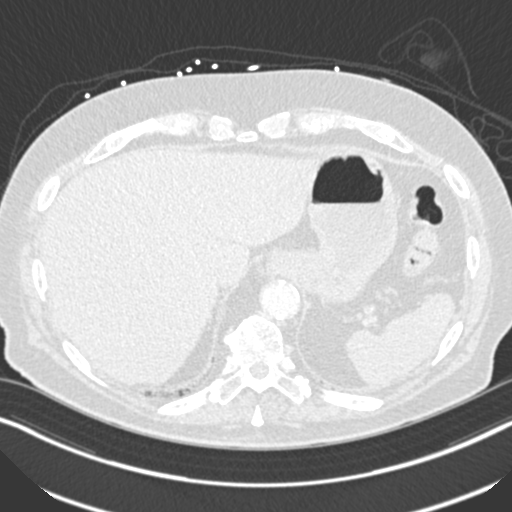
[im 101/318  mediastinal]
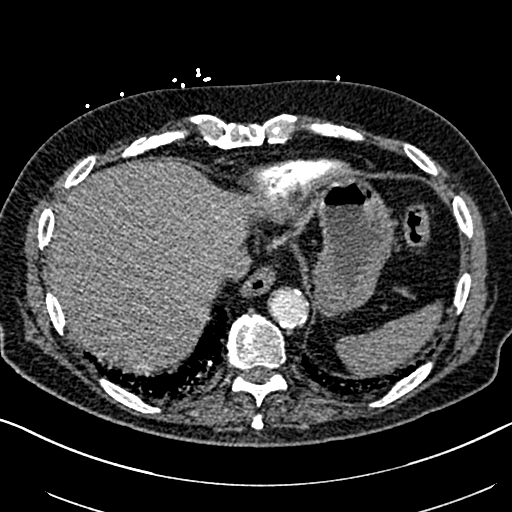
[im 117/318  lung]
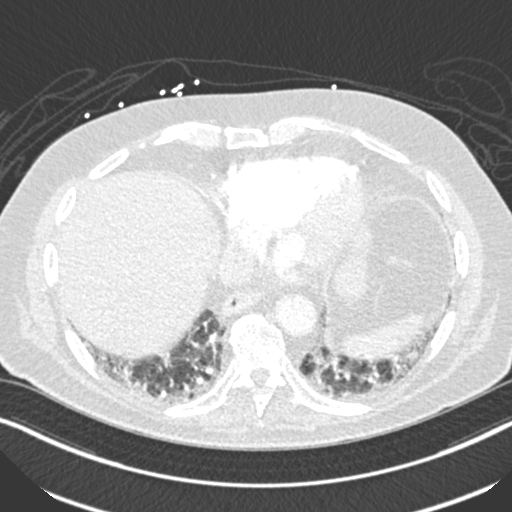
[im 134/318  mediastinal]
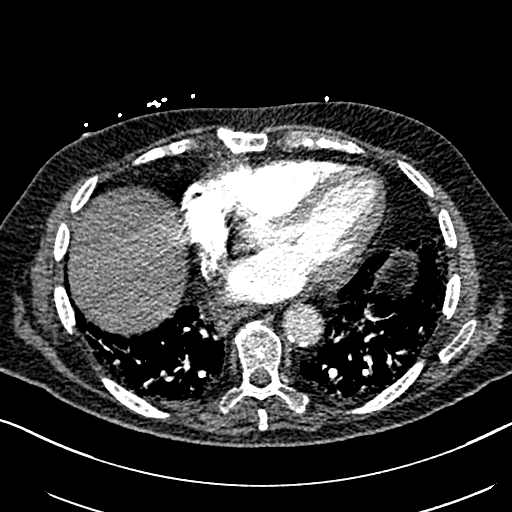
[im 167/318  lung]
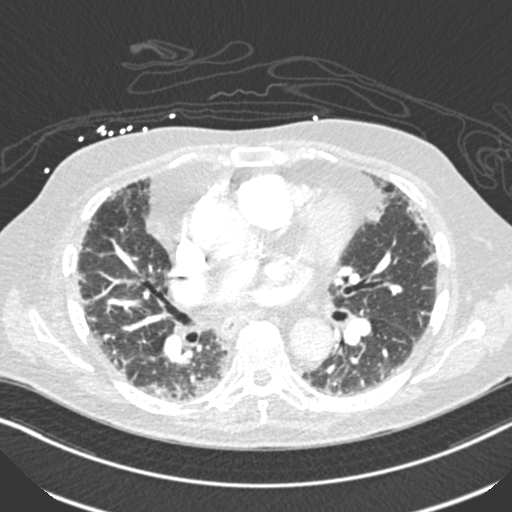
[im 184/318  mediastinal]
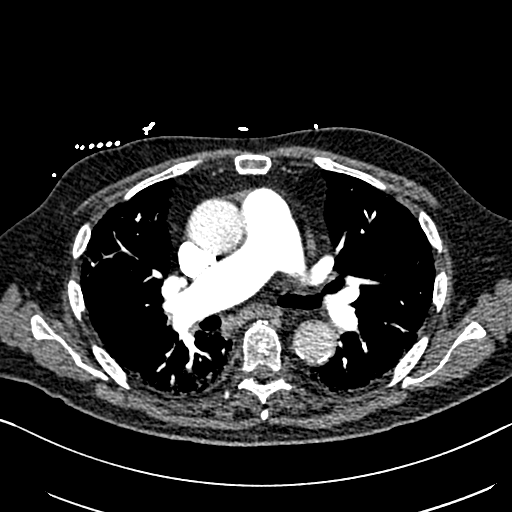
[im 201/318  lung]
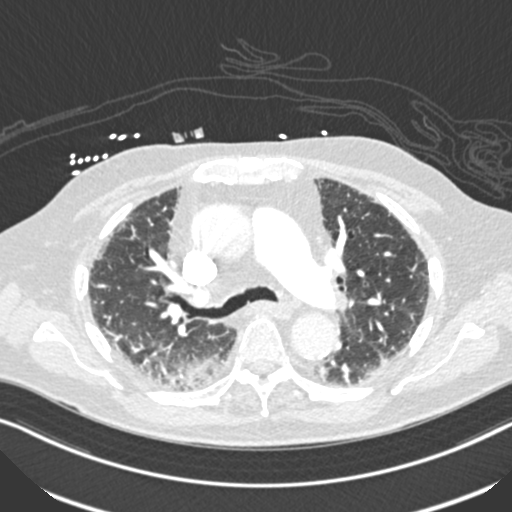
[im 217/318  mediastinal]
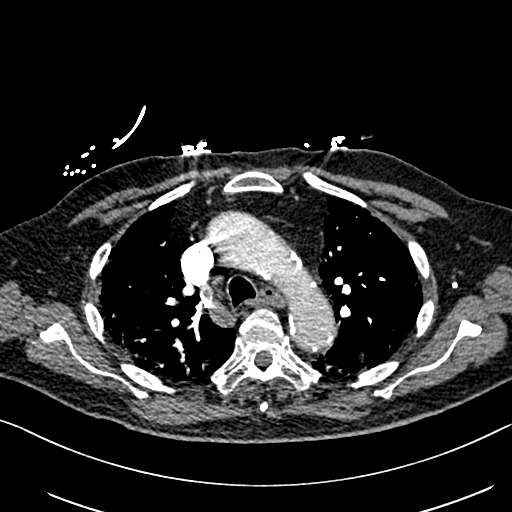
[im 234/318  lung]
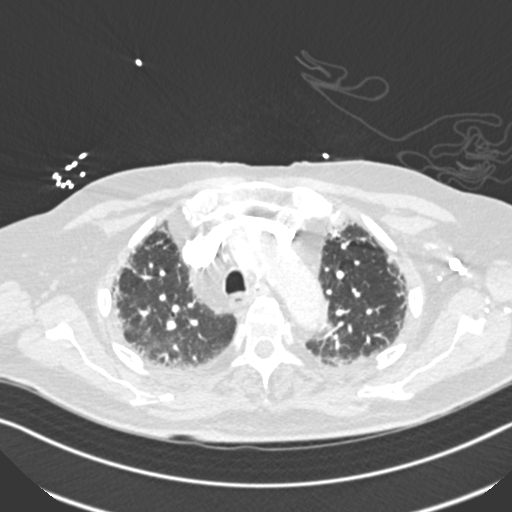
[im 251/318  mediastinal]
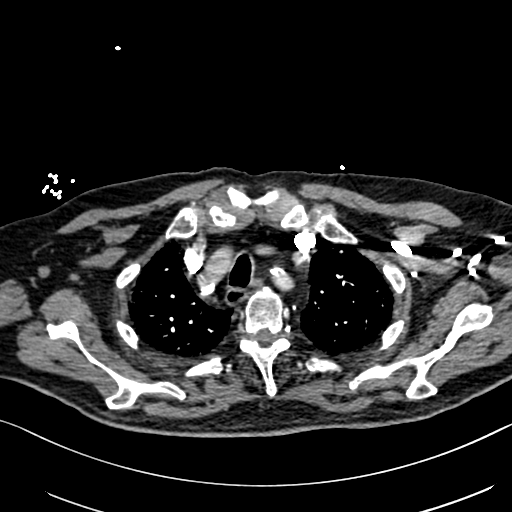
[im 267/318  lung]
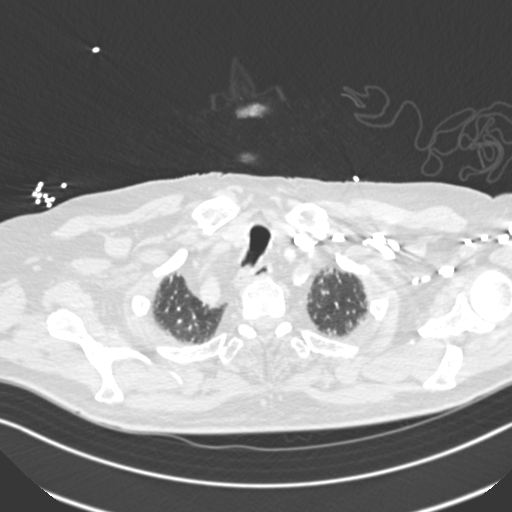
[im 284/318  mediastinal]
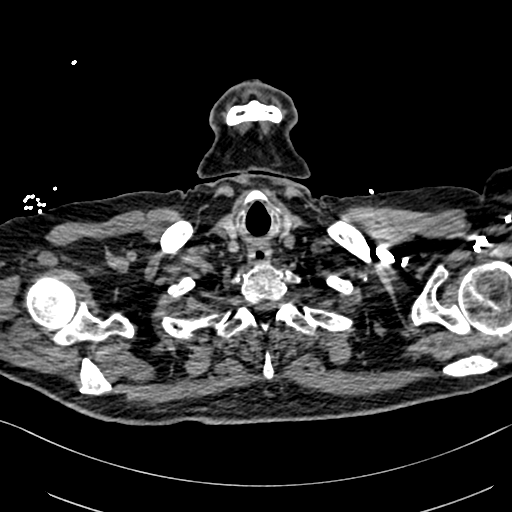
[im 301/318  lung]
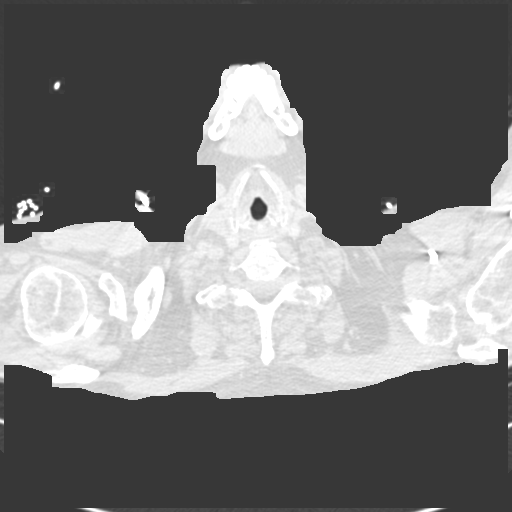

[18 of 36 positions shown; findings below may reference images not displayed]

FINDINGS: Cardiovascular: Satisfactory opacification of the pulmonary arteries
to the segmental level. No evidence of pulmonary embolism. The main
pulmonary artery is normal in caliber. Normal heart size. No
significant pericardial effusion. The thoracic aorta is normal in
caliber. At least moderate atherosclerotic plaque of the thoracic
aorta. At least mild1 vessel coronary artery calcifications.

Mediastinum/Nodes: No enlarged mediastinal, hilar, or axillary lymph
nodes. Thyroid gland, trachea, and esophagus demonstrate no
significant findings.

Lungs/Pleura: Mild scattered peripheral reticulations. Calcified
granuloma ([DATE]). Subpleural right upper lobe 5 mm nodule along the
right major fissure ([DATE], [DATE]). No pulmonary mass no focal
consolidation. No pleural effusion. No pneumothorax.

Upper Abdomen: No acute abnormality.

Musculoskeletal:

No abdominal wall hernia or abnormality

No suspicious lytic or blastic osseous lesions. No acute displaced
fracture. Multilevel degenerative changes of the spine.

Review of the MIP images confirms the above findings.
IMPRESSION: 1. No pulmonary embolus.
2. No acute intrathoracic abnormality in a patient with findings
suggestive of mild pulmonary fibrosis.
3. A 5 mm subpleural right upper lobe nodule. No follow-up needed if
patient is low-risk. Non-contrast chest CT can be considered in 12
months if patient is high-risk. This recommendation follows the
consensus statement: Guidelines for Management of Incidental
Pulmonary Nodules Detected on CT Images: From the [HOSPITAL]
4. Aortic Atherosclerosis ([id number]-NET.T) as well as at least mild
four-vessel.

## 2022-12-25 ENCOUNTER — Telehealth: Payer: Self-pay | Admitting: Family Medicine

## 2022-12-25 NOTE — Telephone Encounter (Signed)
Copied from CRM [id number]. Topic: Medicare AWV >> Dec 25, 2022  9:24 AM Payton Doughty wrote: Reason for CRM: Called 12/25/2022 to sched Annual Wellness Visit - NO VOICEMAIL  Verlee Rossetti; Care Guide Ambulatory Clinical Support [place] [facility] Direct Dial: [phone]
